# Patient Record
Sex: Female | Born: 1980 | Race: Black or African American | Hispanic: No | Marital: Single | Smoking: Never smoker
Health system: Southern US, Community
[De-identification: ages and names within clinical notes are randomized; demographics above are authoritative.]

## PROBLEM LIST (undated history)

## (undated) DIAGNOSIS — A599 Trichomoniasis, unspecified: Secondary | ICD-10-CM

## (undated) DIAGNOSIS — N200 Calculus of kidney: Secondary | ICD-10-CM

## (undated) DIAGNOSIS — O139 Gestational [pregnancy-induced] hypertension without significant proteinuria, unspecified trimester: Secondary | ICD-10-CM

## (undated) DIAGNOSIS — F329 Major depressive disorder, single episode, unspecified: Secondary | ICD-10-CM

## (undated) DIAGNOSIS — Z8669 Personal history of other diseases of the nervous system and sense organs: Secondary | ICD-10-CM

## (undated) DIAGNOSIS — Z8742 Personal history of other diseases of the female genital tract: Secondary | ICD-10-CM

## (undated) DIAGNOSIS — K219 Gastro-esophageal reflux disease without esophagitis: Secondary | ICD-10-CM

## (undated) DIAGNOSIS — IMO0001 Reserved for inherently not codable concepts without codable children: Secondary | ICD-10-CM

## (undated) DIAGNOSIS — J45909 Unspecified asthma, uncomplicated: Secondary | ICD-10-CM

## (undated) DIAGNOSIS — R109 Unspecified abdominal pain: Secondary | ICD-10-CM

## (undated) DIAGNOSIS — Z973 Presence of spectacles and contact lenses: Secondary | ICD-10-CM

## (undated) DIAGNOSIS — D649 Anemia, unspecified: Secondary | ICD-10-CM

## (undated) DIAGNOSIS — Z87442 Personal history of urinary calculi: Secondary | ICD-10-CM

## (undated) DIAGNOSIS — I10 Essential (primary) hypertension: Secondary | ICD-10-CM

## (undated) HISTORY — DX: Unspecified abdominal pain: R10.9

## (undated) HISTORY — PX: OTHER SURGICAL HISTORY: SHX169

## (undated) HISTORY — PX: LITHOTRIPSY: SUR834

## (undated) HISTORY — DX: Personal history of other diseases of the nervous system and sense organs: Z86.69

## (undated) HISTORY — PX: WISDOM TOOTH EXTRACTION: SHX21

## (undated) HISTORY — DX: Presence of spectacles and contact lenses: Z97.3

## (undated) HISTORY — DX: Trichomoniasis, unspecified: A59.9

## (undated) HISTORY — DX: Reserved for inherently not codable concepts without codable children: IMO0001

## (undated) HISTORY — DX: Major depressive disorder, single episode, unspecified: F32.9

## (undated) HISTORY — DX: Personal history of other diseases of the female genital tract: Z87.42

## (undated) HISTORY — DX: Personal history of urinary calculi: Z87.442

## (undated) HISTORY — DX: Calculus of kidney: N20.0

## (undated) HISTORY — DX: Anemia, unspecified: D64.9

## (undated) HISTORY — PX: KIDNEY STONE SURGERY: SHX686

## (undated) HISTORY — DX: Gestational (pregnancy-induced) hypertension without significant proteinuria, unspecified trimester: O13.9

---
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
## 1999-10-06 ENCOUNTER — Encounter: Payer: Self-pay | Admitting: Emergency Medicine

## 1999-10-06 ENCOUNTER — Emergency Department (HOSPITAL_COMMUNITY): Admission: EM | Admit: 1999-10-06 | Discharge: 1999-10-06 | Payer: Self-pay | Admitting: Emergency Medicine

## 2002-12-16 ENCOUNTER — Encounter: Payer: Self-pay | Admitting: Emergency Medicine

## 2002-12-16 ENCOUNTER — Emergency Department (HOSPITAL_COMMUNITY): Admission: EM | Admit: 2002-12-16 | Discharge: 2002-12-16 | Payer: Self-pay | Admitting: Emergency Medicine

## 2003-08-13 DIAGNOSIS — A599 Trichomoniasis, unspecified: Secondary | ICD-10-CM

## 2003-08-13 HISTORY — DX: Trichomoniasis, unspecified: A59.9

## 2003-10-19 ENCOUNTER — Other Ambulatory Visit: Admission: RE | Admit: 2003-10-19 | Discharge: 2003-10-19 | Payer: Self-pay | Admitting: Obstetrics and Gynecology

## 2004-03-15 ENCOUNTER — Inpatient Hospital Stay (HOSPITAL_COMMUNITY): Admission: AD | Admit: 2004-03-15 | Discharge: 2004-03-16 | Payer: Self-pay | Admitting: Obstetrics and Gynecology

## 2004-03-29 ENCOUNTER — Inpatient Hospital Stay (HOSPITAL_COMMUNITY): Admission: AD | Admit: 2004-03-29 | Discharge: 2004-04-01 | Payer: Self-pay | Admitting: Family Medicine

## 2004-07-11 ENCOUNTER — Emergency Department (HOSPITAL_COMMUNITY): Admission: EM | Admit: 2004-07-11 | Discharge: 2004-07-12 | Payer: Self-pay | Admitting: Emergency Medicine

## 2004-07-13 ENCOUNTER — Ambulatory Visit (HOSPITAL_BASED_OUTPATIENT_CLINIC_OR_DEPARTMENT_OTHER): Admission: RE | Admit: 2004-07-13 | Discharge: 2004-07-13 | Payer: Self-pay | Admitting: Urology

## 2004-07-13 ENCOUNTER — Ambulatory Visit (HOSPITAL_COMMUNITY): Admission: RE | Admit: 2004-07-13 | Discharge: 2004-07-13 | Payer: Self-pay | Admitting: Urology

## 2004-07-19 ENCOUNTER — Ambulatory Visit (HOSPITAL_COMMUNITY): Admission: RE | Admit: 2004-07-19 | Discharge: 2004-07-19 | Payer: Self-pay | Admitting: Urology

## 2005-03-25 ENCOUNTER — Emergency Department (HOSPITAL_COMMUNITY): Admission: EM | Admit: 2005-03-25 | Discharge: 2005-03-25 | Payer: Self-pay | Admitting: Emergency Medicine

## 2005-11-16 ENCOUNTER — Emergency Department (HOSPITAL_COMMUNITY): Admission: EM | Admit: 2005-11-16 | Discharge: 2005-11-16 | Payer: Self-pay | Admitting: Emergency Medicine

## 2005-12-26 ENCOUNTER — Other Ambulatory Visit: Admission: RE | Admit: 2005-12-26 | Discharge: 2005-12-26 | Payer: Self-pay | Admitting: Obstetrics and Gynecology

## 2006-02-22 ENCOUNTER — Emergency Department (HOSPITAL_COMMUNITY): Admission: EM | Admit: 2006-02-22 | Discharge: 2006-02-22 | Payer: Self-pay | Admitting: Emergency Medicine

## 2006-05-19 ENCOUNTER — Emergency Department (HOSPITAL_COMMUNITY): Admission: EM | Admit: 2006-05-19 | Discharge: 2006-05-20 | Payer: Self-pay | Admitting: Emergency Medicine

## 2006-08-29 ENCOUNTER — Emergency Department (HOSPITAL_COMMUNITY): Admission: EM | Admit: 2006-08-29 | Discharge: 2006-08-29 | Payer: Self-pay | Admitting: Emergency Medicine

## 2006-09-18 ENCOUNTER — Emergency Department (HOSPITAL_COMMUNITY): Admission: EM | Admit: 2006-09-18 | Discharge: 2006-09-18 | Payer: Self-pay | Admitting: Family Medicine

## 2006-11-06 ENCOUNTER — Encounter: Payer: Self-pay | Admitting: Family Medicine

## 2006-11-06 ENCOUNTER — Ambulatory Visit: Payer: Self-pay | Admitting: Family Medicine

## 2006-11-06 ENCOUNTER — Encounter (INDEPENDENT_AMBULATORY_CARE_PROVIDER_SITE_OTHER): Payer: Self-pay | Admitting: Family Medicine

## 2006-11-06 DIAGNOSIS — R7989 Other specified abnormal findings of blood chemistry: Secondary | ICD-10-CM | POA: Insufficient documentation

## 2006-11-06 DIAGNOSIS — N200 Calculus of kidney: Secondary | ICD-10-CM | POA: Insufficient documentation

## 2006-11-06 DIAGNOSIS — J309 Allergic rhinitis, unspecified: Secondary | ICD-10-CM | POA: Insufficient documentation

## 2006-11-06 DIAGNOSIS — G609 Hereditary and idiopathic neuropathy, unspecified: Secondary | ICD-10-CM | POA: Insufficient documentation

## 2006-11-06 LAB — CONVERTED CEMR LAB
AST: 29 units/L (ref 0–37)
Alkaline Phosphatase: 73 units/L (ref 39–117)
BUN: 7 mg/dL (ref 6–23)
CO2: 29 meq/L (ref 19–32)
Chloride: 107 meq/L (ref 96–112)
Creatinine, Ser: 0.5 mg/dL (ref 0.4–1.2)
Potassium: 3.9 meq/L (ref 3.5–5.1)
Sodium: 138 meq/L (ref 135–145)
TSH: 0.66 microintl units/mL (ref 0.35–5.50)
Total Bilirubin: 0.6 mg/dL (ref 0.3–1.2)
Total Protein: 6.5 g/dL (ref 6.0–8.3)

## 2006-11-13 ENCOUNTER — Ambulatory Visit: Payer: Self-pay | Admitting: Family Medicine

## 2006-11-13 LAB — CONVERTED CEMR LAB
Glucose, 2 hour: 108 mg/dL (ref 70–139)
Glucose, GTT - 3 Hour: 78 mg/dL

## 2006-12-18 ENCOUNTER — Encounter (INDEPENDENT_AMBULATORY_CARE_PROVIDER_SITE_OTHER): Payer: Self-pay | Admitting: Family Medicine

## 2007-01-30 ENCOUNTER — Encounter (INDEPENDENT_AMBULATORY_CARE_PROVIDER_SITE_OTHER): Payer: Self-pay | Admitting: Family Medicine

## 2007-03-23 ENCOUNTER — Encounter (INDEPENDENT_AMBULATORY_CARE_PROVIDER_SITE_OTHER): Payer: Self-pay | Admitting: Family Medicine

## 2007-05-25 ENCOUNTER — Encounter (INDEPENDENT_AMBULATORY_CARE_PROVIDER_SITE_OTHER): Payer: Self-pay | Admitting: Family Medicine

## 2007-07-06 ENCOUNTER — Encounter (INDEPENDENT_AMBULATORY_CARE_PROVIDER_SITE_OTHER): Payer: Self-pay | Admitting: Family Medicine

## 2007-07-08 ENCOUNTER — Encounter: Admission: RE | Admit: 2007-07-08 | Discharge: 2007-07-08 | Payer: Self-pay | Admitting: Gastroenterology

## 2008-07-06 ENCOUNTER — Inpatient Hospital Stay (HOSPITAL_COMMUNITY): Admission: EM | Admit: 2008-07-06 | Discharge: 2008-07-07 | Payer: Self-pay | Admitting: Emergency Medicine

## 2008-07-22 ENCOUNTER — Ambulatory Visit: Payer: Self-pay | Admitting: Vascular Surgery

## 2008-07-22 ENCOUNTER — Ambulatory Visit (HOSPITAL_COMMUNITY): Admission: RE | Admit: 2008-07-22 | Discharge: 2008-07-22 | Payer: Self-pay | Admitting: Orthopedic Surgery

## 2008-07-22 ENCOUNTER — Encounter (INDEPENDENT_AMBULATORY_CARE_PROVIDER_SITE_OTHER): Payer: Self-pay | Admitting: Orthopedic Surgery

## 2008-08-03 ENCOUNTER — Inpatient Hospital Stay (HOSPITAL_COMMUNITY): Admission: AD | Admit: 2008-08-03 | Discharge: 2008-08-03 | Payer: Self-pay | Admitting: Family Medicine

## 2008-08-08 ENCOUNTER — Encounter: Admission: RE | Admit: 2008-08-08 | Discharge: 2008-08-09 | Payer: Self-pay | Admitting: Orthopedic Surgery

## 2008-08-15 ENCOUNTER — Encounter: Admission: RE | Admit: 2008-08-15 | Discharge: 2008-11-13 | Payer: Self-pay | Admitting: Orthopedic Surgery

## 2008-11-17 ENCOUNTER — Encounter: Admission: RE | Admit: 2008-11-17 | Discharge: 2008-11-30 | Payer: Self-pay | Admitting: Orthopedic Surgery

## 2008-11-28 ENCOUNTER — Emergency Department (HOSPITAL_COMMUNITY): Admission: EM | Admit: 2008-11-28 | Discharge: 2008-11-28 | Payer: Self-pay | Admitting: Emergency Medicine

## 2009-01-17 ENCOUNTER — Inpatient Hospital Stay (HOSPITAL_COMMUNITY): Admission: AD | Admit: 2009-01-17 | Discharge: 2009-01-17 | Payer: Self-pay | Admitting: Obstetrics and Gynecology

## 2009-01-18 ENCOUNTER — Inpatient Hospital Stay (HOSPITAL_COMMUNITY): Admission: AD | Admit: 2009-01-18 | Discharge: 2009-01-18 | Payer: Self-pay | Admitting: Obstetrics & Gynecology

## 2009-02-09 ENCOUNTER — Inpatient Hospital Stay (HOSPITAL_COMMUNITY): Admission: AD | Admit: 2009-02-09 | Discharge: 2009-02-09 | Payer: Self-pay | Admitting: Obstetrics and Gynecology

## 2009-03-02 ENCOUNTER — Inpatient Hospital Stay (HOSPITAL_COMMUNITY): Admission: AD | Admit: 2009-03-02 | Discharge: 2009-03-03 | Payer: Self-pay | Admitting: Obstetrics and Gynecology

## 2009-03-10 ENCOUNTER — Inpatient Hospital Stay (HOSPITAL_COMMUNITY): Admission: RE | Admit: 2009-03-10 | Discharge: 2009-03-12 | Payer: Self-pay | Admitting: Obstetrics and Gynecology

## 2009-03-10 ENCOUNTER — Encounter (INDEPENDENT_AMBULATORY_CARE_PROVIDER_SITE_OTHER): Payer: Self-pay | Admitting: Obstetrics and Gynecology

## 2009-03-10 DIAGNOSIS — O139 Gestational [pregnancy-induced] hypertension without significant proteinuria, unspecified trimester: Secondary | ICD-10-CM

## 2009-03-10 HISTORY — DX: Gestational (pregnancy-induced) hypertension without significant proteinuria, unspecified trimester: O13.9

## 2009-03-14 ENCOUNTER — Observation Stay (HOSPITAL_COMMUNITY): Admission: AD | Admit: 2009-03-14 | Discharge: 2009-03-16 | Payer: Self-pay | Admitting: Obstetrics and Gynecology

## 2009-03-20 DIAGNOSIS — Z8742 Personal history of other diseases of the female genital tract: Secondary | ICD-10-CM

## 2009-03-20 HISTORY — DX: Personal history of other diseases of the female genital tract: Z87.42

## 2009-04-05 DIAGNOSIS — IMO0001 Reserved for inherently not codable concepts without codable children: Secondary | ICD-10-CM

## 2009-04-05 HISTORY — DX: Reserved for inherently not codable concepts without codable children: IMO0001

## 2009-04-13 DIAGNOSIS — F32A Depression, unspecified: Secondary | ICD-10-CM

## 2009-04-13 HISTORY — DX: Depression, unspecified: F32.A

## 2010-07-07 ENCOUNTER — Emergency Department (HOSPITAL_COMMUNITY): Admission: EM | Admit: 2010-07-07 | Discharge: 2010-07-07 | Payer: Self-pay | Admitting: Emergency Medicine

## 2010-11-17 LAB — URINALYSIS, ROUTINE W REFLEX MICROSCOPIC
Bilirubin Urine: NEGATIVE
Glucose, UA: NEGATIVE mg/dL
Ketones, ur: 15 mg/dL — AB
Nitrite: NEGATIVE
Specific Gravity, Urine: >1.03 — ABNORMAL HIGH (ref 1.005–1.030)
pH: 6 (ref 5.0–8.0)

## 2010-11-17 LAB — URINE MICROSCOPIC-ADD ON

## 2010-11-17 LAB — URINE CULTURE: Colony Count: NO GROWTH

## 2010-11-17 LAB — CULTURE, BLOOD (ROUTINE X 2)

## 2010-11-17 LAB — DIFFERENTIAL
Eosinophils Relative: 0 % (ref 0–5)
Lymphocytes Relative: 6 % — ABNORMAL LOW (ref 12–46)
Lymphs Abs: 0.8 10*3/uL (ref 0.7–4.0)
Monocytes Absolute: 0.6 10*3/uL (ref 0.1–1.0)
Monocytes Relative: 4 % (ref 3–12)
Neutro Abs: 12.2 10*3/uL — ABNORMAL HIGH (ref 1.7–7.7)

## 2010-11-17 LAB — CBC
HCT: 33.6 % — ABNORMAL LOW (ref 36.0–46.0)
Hemoglobin: 11 g/dL — ABNORMAL LOW (ref 12.0–15.0)
RBC: 4.17 MIL/uL (ref 3.87–5.11)
RDW: 19.1 % — ABNORMAL HIGH (ref 11.5–15.5)

## 2010-11-18 LAB — CBC
HCT: 26.1 % — ABNORMAL LOW (ref 36.0–46.0)
HCT: 31.1 % — ABNORMAL LOW (ref 36.0–46.0)
Hemoglobin: 8.6 g/dL — ABNORMAL LOW (ref 12.0–15.0)
MCV: 80.2 fL (ref 78.0–100.0)
MCV: 80.7 fL (ref 78.0–100.0)
Platelets: 146 10*3/uL — ABNORMAL LOW (ref 150–400)
Platelets: 154 10*3/uL (ref 150–400)
RDW: 18.5 % — ABNORMAL HIGH (ref 11.5–15.5)
WBC: 14.6 10*3/uL — ABNORMAL HIGH (ref 4.0–10.5)
WBC: 16.1 10*3/uL — ABNORMAL HIGH (ref 4.0–10.5)

## 2010-11-18 LAB — URINALYSIS, DIPSTICK ONLY
Glucose, UA: NEGATIVE mg/dL
Leukocytes, UA: NEGATIVE
Protein, ur: 100 mg/dL — AB
pH: 6 (ref 5.0–8.0)

## 2010-11-18 LAB — RPR: RPR Ser Ql: NONREACTIVE

## 2010-11-18 LAB — COMPREHENSIVE METABOLIC PANEL
Albumin: 2.6 g/dL — ABNORMAL LOW (ref 3.5–5.2)
BUN: 5 mg/dL — ABNORMAL LOW (ref 6–23)
Calcium: 9.4 mg/dL (ref 8.4–10.5)
Glucose, Bld: 95 mg/dL (ref 70–99)
Potassium: 4.2 mEq/L (ref 3.5–5.1)
Sodium: 136 mEq/L (ref 135–145)
Total Protein: 5.9 g/dL — ABNORMAL LOW (ref 6.0–8.3)

## 2010-11-18 LAB — RAPID HIV SCREEN (WH-MAU): Rapid HIV Screen: NONREACTIVE

## 2010-11-18 LAB — URIC ACID: Uric Acid, Serum: 6 mg/dL (ref 2.4–7.0)

## 2010-11-19 LAB — URINALYSIS, ROUTINE W REFLEX MICROSCOPIC
Bilirubin Urine: NEGATIVE
Glucose, UA: NEGATIVE mg/dL
Hgb urine dipstick: NEGATIVE
Ketones, ur: NEGATIVE mg/dL
Nitrite: NEGATIVE
Specific Gravity, Urine: 1.01 (ref 1.005–1.030)
pH: 6 (ref 5.0–8.0)

## 2010-11-19 LAB — FETAL FIBRONECTIN: Fetal Fibronectin: NEGATIVE

## 2010-11-19 LAB — WET PREP, GENITAL
Trich, Wet Prep: NONE SEEN
Yeast Wet Prep HPF POC: NONE SEEN

## 2010-11-19 LAB — STREP B DNA PROBE

## 2010-12-25 NOTE — Discharge Summary (Signed)
[name], [name]                 ACCOUNT NO.:  [id number]   MEDICAL RECORD NO.:  [id number]          PATIENT TYPE:  INP   LOCATION:  9303                          FACILITY:  [facility]   PHYSICIAN:  Hal Morales, M.D.DATE OF BIRTH:  Nov 11, 1980   DATE OF ADMISSION:  03/14/2009  DATE OF DISCHARGE:  03/16/2009                               DISCHARGE SUMMARY   ADMITTING DIAGNOSES:  1. Status post spontaneous vaginal delivery on March 10, 2009.  2. Postpartum fever.   DISCHARGE DIAGNOSES:  1. Five days status post spontaneous vaginal delivery.  2. Resolving likely postpartum endometritis.   PROCEDURES:  None.   HOSPITAL COURSE:  Nicole [name] is a 30 year old gravida 3, para 2-0-1-0,  who was admitted on March 14, 2009, with history of a postpartum fever  to 100.5 the night before.  She was seen in the office on the day of  admission and was found to have some mild uterine tenderness.  Temperature at that time was 99.2.  She also had chills and body aches.  She was having no issues with her breath.  She was breast-feeding.  She  also denied any dysuria, any leg pain, any nausea or vomiting.  She was  sent to Maternity Admissions Unit for evaluation, white blood cell count  was 13.6, hemoglobin was 11, and platelet count was normal.  There was a  large amount of blood in the urine, 15 ketones were noted.  Her blood  pressures were 150s/170s over 70s to 99.  She was previously on  Procardia XL 30 mg for some chronic hypertension.  The uterus was well  bloated.  She was having small amount of lochia and had no foul odor.  The patient was admitted then for observation and IV antibiotics with  the likely diagnosis of endometritis.  She was given Rocephin 250 mg IM.  She was admitted overnight for further observation and given ampicillin  1 g IV q.6 h. and was medicated with Tylenol for fever.  By the morning  of March 15, 2009, she was feeling some better.  She was still having  some mild  lower uterine tenderness.  Her max temperature of 101.5.  It  was down at that time to 98 and her blood pressures 131/88.  She was  given the option of later that morning of remaining while going home,  p.o. antibiotics at that time she preferred to remain one more night due  to still having some chills.  Her ampicillin IV was discontinued.  She  was then started on Augmentin 875 mg one p.o. b.i.d.  She was continued  on ibuprofen and was held until she was at least 24 hours of post fever.  By the morning of August, 2010, she was feeling much better.  She had  taken pain medication and she still having some mild lower uterine  cramping and tenderness.  She was breast and bottle feeding.  Her max  temperature have been 99.8, at 0525 in the morning.  Her last temp of  any significant to 100.3 at 3 a.m. on March 15, 2009.  She denied  nausea, vomiting, dysuria, or any other issues.  Urine cultures were  negative.  Blood cultures were negative preliminarily.   Her physical exam was within normal limits.  She was having no rebound  or guarding.  She had had a bowel movement this morning.  Lochia was  scant.  Perineum was clear.  Dr. Pennie Rushing was consulted and the decision  was made to discharge the patient home.   DISCHARGE CONDITION:  Stable.   DISCHARGE INSTRUCTIONS:  Routine postpartum instructions were to  continue.  The patient will also monitor for fever greater than 100, any  chills or any other issues she will notify us.   DISCHARGE MEDICATIONS:  1. Augmentin 875 mg p.o. b.i.d. to complete a 7-day course.      Therefore, she will be on a total of additional 5 days.  2. Ibuprofen 600 mg p.o. q.6 h. p.r.n. pain.  3. The patient will continue her Procardia XL 30 mg p.o. daily.   DISCHARGE FOLLOWUP:  Will occur on Monday, March 20, 2009, at 11 a.m. as  previously scheduled at [facility] or p.r.n.      Nicole [name], C.N.M.      Hal Morales, M.D.   Electronically Signed    VLL/MEDQ  D:  03/16/2009  T:  03/17/2009  Job:  [id number]

## 2010-12-25 NOTE — H&P (Signed)
NAMEJUSTYCE, YEATER                 ACCOUNT NO.:  [id number]   MEDICAL RECORD NO.:  [id number]          PATIENT TYPE:  INP   LOCATION:  9125                          FACILITY:  [facility]   PHYSICIAN:  Naima A. Dillard, M.D. DATE OF BIRTH:  Oct 20, 1980   DATE OF ADMISSION:  03/10/2009  DATE OF DISCHARGE:                              HISTORY & PHYSICAL   ADMIT NOTE:  A 30 year old gravida 3, para 1, AB1 with last menstrual  period on May 28, 2008 with [facility] is March 04, 2009, is a female from  [place] presents to Labor and Delivery for induction of labor for  postdates.   ADMITTING DIAGNOSES:  1. Intrauterine pregnancy at 65 and 6/7th postdates.  2. Increased body mass index.  3. History of asthma, she uses albuterol inhaler.  4. History of migraines.  5. History of anemia on iron supplements.   HISTORY OF PRESENT ILLNESS:  Entry to care of [facility]  beginning on September 19, 2008 presented on crutches due to a broken  ankle, tibia, and fibula.  She had a cast on.  The anatomy scan was  within normal limits.  The quad screen was negative. Cervical length  3.12 initially, placenta location was posterior, history of anemia, and  she has been treated with ferrous sulfate at pregnancy.  She was treated  for urinary tract infection at pregnancy due to E. coli and she was on  Macrobid.   LABORATORY DATA:  She is O positive, Rh antibody negative, sickle cell  trait negative, rubella immune, one-hour Glucola was 97, CF negative,  Pap within normal limits.  GC and Chlamydia negative.  Ordered an HIV,  not on the chart.  GBS is negative.   PAST MEDICAL HISTORY:  She has seasonal allergies.  She has had a  history of kidney stones, denied latex or medication allergies.  She is  asthmatic, uses inhaler.  Denies epilepsy or heart disease.   PAST SURGICAL HISTORY:  She had an elective abortion in 2000, surgery on  kidney stones x3, 2004, 2005, and 2007; fracture of  tibia, fibula  and  ankle in 2009;  wisdom teeth 2005 and 2007.   PAST OBSTETRIC HISTORY:  Menses began at 30 years old, every 28 days,  lasting 2-4 days.  In 2005, she had a spontaneous vaginal delivery of a  viable female infant after 12 hours of labor, weighing 6 pounds 14  ounces.  In 2000, she had an elective AB and she has a history of  __________.   SOCIAL HISTORY:  She is married to B[place]roft, who is a high Optician, dispensing on leave.  The patient is a high school graduate as well as her  husband.  The patient is from [place] of [place] and speaks [place].  Denies drugs, alcohol, or smoking.   FAMILY HISTORY:  Mother has chronic hypertension.  Her sister had  leukemia and is now deceased.  Her husband has twin sisters.   ADMISSION PHYSICAL:  HEART:  Regular rate without murmur.  LUNGS:  Clear bilaterally.  ABDOMEN:  Gravid.  Estimated fetal weight 7.5-8 pounds.  GU:  Vaginal exam per Dr. Normand Sloop, she was 3-4 cm with spontaneous  rupture of membranes, 70% effaced with -3 vertex.  Fetal heart tone 130-  140, positive accelerations, contractions every 2-3 minutes.   ASSESSMENT:  Intrauterine pregnancy at 40.6 induction of labor due to  postdates.   PLAN:  Admit to [facility] Redmond [facility].  Epidural if requested.  Pain medication IV.  HIV was ordered due to none  on the chart.  Anticipate birth.      Jasmine Awe, CNM      Naima A. Normand Sloop, M.D.  Electronically Signed    JM/MEDQ  D:  03/10/2009  T:  03/10/2009  Job:  [id number]

## 2010-12-25 NOTE — Op Note (Signed)
Nicole [name], Nicole [name]                 ACCOUNT NO.:  [id number]   MEDICAL RECORD NO.:  [id number]          PATIENT TYPE:  INP   LOCATION:  1610                         FACILITY:  [facility]   PHYSICIAN:  Burnard Bunting, M.D.    DATE OF BIRTH:  1980/10/02   DATE OF PROCEDURE:  07/06/2008  DATE OF DISCHARGE:                               OPERATIVE REPORT   PREOPERATIVE DIAGNOSIS:  Left ankle trimalleolar ankle fracture.   POSTOPERATIVE DIAGNOSIS:  Left ankle trimalleolar ankle fracture.   PROCEDURE:  Left ankle trimalleolar ankle fracture open reduction  internal fixation of the bimalleolar portion.   SURGEON:  Burnard Bunting, M.D.   ASSISTANT:  Jerolyn Shin. Tresa Res, M.D.   ANESTHESIA:  Spinal.   ESTIMATED BLOOD LOSS:  12 mL.   INDICATIONS:  [name] is a 30 year old patient who injured her left ankle.  She presents now for operative management after explanation of risks and  benefits.   PROCEDURE IN DETAIL:  Patient brought to the operative room where spinal  anesthesia was induced. Preoperative IV antibiotics were administered.  Left leg was prescrubbed with alcohol and Betadine which was allowed to  air dry. Then, prepped with DuraPrep solution and draped in a sterile  manner. [name] was used to cover the operative field. Ankle Esmarch was  utilized for approximately 50 minutes. Lateral incision was made. Care  was taken to avoid injury to the superficial perineal nerve. The  fracture was identified. Periosteum was elevated anteriorly and  posteriorly. Fracture site was irrigated, reduced, and fixed with one  3.5 lag screw placed proximal anterior to distal posterior. Good  reduction of the fracture was achieved. A 7-hole lateral fibular plate  was applied with good purchase of the screws. Syndesmosis was then  tested and found to be stable. At this time, the incision was irrigated  and packed with a small wet sponge. Medial incision was then made over  the medial malleolus. Care was taken  to avoid injury to the saphenous  vein and nerve. Fracture was identified. Periosteum was elevated from  out of the fracture site. Irrigation was performed. Fracture was reduced  and held with reduction clamps. Two 60-mm malleolar screws were placed  with good purchase obtained. Under fluoroscopic examination, screw  lengths were found to be intact. The mortis was reduced. At this time,  the syndesmosis was again tested under fluoroscopy and found to be  stable.  Tourniquet was released. Both incisions were then irrigated and  closed using interrupted running 2-0 Vicryl suture and 3-0 simple nylon  suture. Bulky well padded posterior splint was applied. The patient  tolerated  the procedure well without immediate complication. Dr. Lenny Pastel  assistance was required during the case for the expeditious nature of  the case.  Because the patient is pregnant and had spinal anesthesia as  well as her limb positioning and retraction of important neurovascular  structures, his assistance was a medical necessity.      Burnard Bunting, M.D.  Electronically Signed     GSD/MEDQ  D:  07/06/2008  T:  07/07/2008  Job:  [id number] 

## 2010-12-25 NOTE — Discharge Summary (Signed)
NAMETUERE, NWOSU                 ACCOUNT NO.:  [id number]   MEDICAL RECORD NO.:  [id number]          PATIENT TYPE:  INP   LOCATION:  9303                          [facility]ACILITY:  [facility]   PHYSICIAN:  Naima A. Dillard, M.D. DATE OF BIRTH:  1981/07/16   DATE OF ADMISSION:  03/14/2009  DATE OF DISCHARGE:                               DISCHARGE SUMMARY   A 30 year old gravida 3, para 2, AB 1 presented to the office in MAU  complaining of fever.  She had a spontaneous vaginal delivery of a  viable female infant weighing 710 with an Apgar of 9 and 9 on March 10, 2009.  Presented to the office with chief complaint of fever over 101.  She states she took Tylenol prior to the office visit.  She presented  from the [facility] for evaluation.  Her labs at that point  were WBC 13.6, hemoglobin 11, hematocrit 33, ketones 15.  Blood in her  urine was large.  Urine was yellow.  Temperature 98.3, pulse 116,  respirations 20, blood pressure ranged 171/99, 159/86.  The patient  stated she did not take her labetalol today.  After the labetalol was  given in the MAU, her blood pressures were 133/88 to 120/79.  Lungs were  clear bilaterally.  Heart, regular rate without murmur.  Abdomen, tender  of the abdomen.  Breasts were full.  She is breastfeeding.  Uterus is 2  fingerbreadths below the umbilicus.  Lochia rubra, negative Homans.   ASSESSMENT:  Postpartum day #5, rule out endometritis and she is  breastfeeding.  Consult with Dr. Stefano Gaul.  We will give Rocephin 250 mg  IM and a script for Augmentin which is safe in pregnancy.  She was to  increase fluids and rest and keep appointment or call us uncomfortable.  While waiting to go home, the nurse called and stated    Dictation ended at this point.      Jasmine Awe, CNM      Naima A. Normand Sloop, M.D.  Electronically Signed    JM/MEDQ  D:  03/15/2009  T:  03/16/2009  Job:  [id number]

## 2010-12-25 NOTE — Consult Note (Signed)
NAMEJEARLEAN, [name]                 ACCOUNT NO.:  [id number]   MEDICAL RECORD NO.:  [id number]          PATIENT TYPE:  INP   LOCATION:  0108                         [facility]AC[facility]   PHYSICIAN:  Burnard Bunting, M.D.    DATE OF BIRTH:  06/04/1981   DATE OF CONSULTATION:  DATE OF DISCHARGE:                                 CONSULTATION   REFERRING Nicole [name]:  Nicole Dolly, PA   CHIEF COMPLAINT:  Back pain.   HISTORY OF PRESENT ILLNESS:  Nicole [name] is a 30 year old female who  injured her left ankle when she fell at home.  She had no loss of  consciousness but was unable to bear weight.  She denies any other  orthopedic complaints.  She denies any numbness or tingling in the foot.  Her pain is quite severe at times.  She states she is comfortable lying  in bed.   PAST MEDICAL HISTORY:  Notable for asthma and kidney stones.   PAST SURGICAL HISTORY:  Negative.   CURRENT MEDICATIONS:  Zyrtec.   ALLERGIES:  None.   All systems reviewed are negative.  The patient is [redacted] weeks pregnant.   She works as a Advertising copywriter.  She does not smoke or drink.  She is here  with her sister.   PHYSICAL EXAMINATION:  She is well-developed, well-nourished, in no  acute distress.  Blood pressure 137/77, pulse 77, respirations 52, temperature is 98.2.  CHEST:  Clear to auscultation.  HEART:  Heart beat is regular.  ABDOMEN:  Benign.  She has full range of motion of her upper extremities.  Palpable radial  pulses bilaterally.  Left ankle demonstrates mild deformity.  Sensation  is intact on dorsal and plantar aspect of the foot.  Compartments are  soft.  The skin is intact on both the medial and lateral side.  She does  have a bit of an external rotation deformity to the ankle.   Radiographs show bimalleolar ankle fracture-dislocation.  EKG and labs  are pending.   IMPRESSION:  Bimalleolar ankle fracture-dislocation with no imminent  skin breakdown in an 8-week pregnant female.  Her medical  decision-  making is definitely complicated by the fact that she is [redacted] weeks  pregnant and I have talked with the anesthesiologist, and we will likely  proceed with a spinal anesthesia for the patient with limited  fluoroscopy time.  Risks and benefits of the procedure were discussed  with the patient and her husband, who is with her tonight.  They include  but are not limited to infection, nonunion, malunion, deep vein  thrombosis.  All questions are answered.  We will see her the surgery  night.      Burnard Bunting, M.D.  Electronically Signed     GSD/MEDQ  D:  07/06/2008  T:  07/07/2008  Job:  [id number]

## 2011-05-14 LAB — BASIC METABOLIC PANEL
CO2: 22
Calcium: 8.8
GFR calc Af Amer: >60
Glucose, Bld: 101 — ABNORMAL HIGH
Potassium: 3.6
Sodium: 136

## 2011-05-14 LAB — DIFFERENTIAL
Basophils Relative: 0
Eosinophils Relative: 1
Lymphocytes Relative: 20
Monocytes Absolute: 0.7
Monocytes Relative: 8
Neutro Abs: 6.7

## 2011-05-14 LAB — URINALYSIS, ROUTINE W REFLEX MICROSCOPIC
Ketones, ur: NEGATIVE
Nitrite: NEGATIVE
Protein, ur: NEGATIVE

## 2011-05-14 LAB — CBC
HCT: 37.3
Hemoglobin: 12.1
MCHC: 32.5
RBC: 4.61
RDW: 15.3

## 2011-05-14 LAB — TYPE AND SCREEN: ABO/RH(D): O POS

## 2011-05-14 LAB — URINE MICROSCOPIC-ADD ON

## 2011-05-14 LAB — APTT: aPTT: 28

## 2011-05-17 LAB — CBC
HCT: 35.6 % — ABNORMAL LOW (ref 36.0–46.0)
Hemoglobin: 11.6 g/dL — ABNORMAL LOW (ref 12.0–15.0)
MCV: 80.4 fL (ref 78.0–100.0)
RBC: 4.43 MIL/uL (ref 3.87–5.11)
WBC: 12.2 10*3/uL — ABNORMAL HIGH (ref 4.0–10.5)

## 2011-05-17 LAB — WET PREP, GENITAL
Clue Cells Wet Prep HPF POC: NONE SEEN
Trich, Wet Prep: NONE SEEN

## 2011-05-17 LAB — GC/CHLAMYDIA PROBE AMP, GENITAL
Chlamydia, DNA Probe: NEGATIVE
GC Probe Amp, Genital: NEGATIVE

## 2012-01-16 ENCOUNTER — Emergency Department (HOSPITAL_COMMUNITY)
Admission: EM | Admit: 2012-01-16 | Discharge: 2012-01-17 | Disposition: A | Discharge status: Home / Self Care | Payer: Medicaid Other | Attending: Emergency Medicine | Admitting: Emergency Medicine

## 2012-01-16 ENCOUNTER — Encounter (HOSPITAL_COMMUNITY): Payer: Self-pay | Admitting: *Deleted

## 2012-01-16 DIAGNOSIS — R10816 Epigastric abdominal tenderness: Secondary | ICD-10-CM | POA: Insufficient documentation

## 2012-01-16 DIAGNOSIS — R110 Nausea: Secondary | ICD-10-CM | POA: Insufficient documentation

## 2012-01-16 DIAGNOSIS — R109 Unspecified abdominal pain: Secondary | ICD-10-CM

## 2012-01-16 DIAGNOSIS — I10 Essential (primary) hypertension: Secondary | ICD-10-CM | POA: Insufficient documentation

## 2012-01-16 HISTORY — DX: Unspecified asthma, uncomplicated: J45.909

## 2012-01-16 HISTORY — DX: Essential (primary) hypertension: I10

## 2012-01-16 HISTORY — DX: Calculus of kidney: N20.0

## 2012-01-16 HISTORY — DX: Gastro-esophageal reflux disease without esophagitis: K21.9

## 2012-01-16 LAB — COMPREHENSIVE METABOLIC PANEL
Alkaline Phosphatase: 93 U/L (ref 39–117)
BUN: 9 mg/dL (ref 6–23)
CO2: 23 mEq/L (ref 19–32)
Chloride: 100 mEq/L (ref 96–112)
GFR calc Af Amer: >90 mL/min (ref >90)
GFR calc non Af Amer: >90 mL/min (ref >90)
Glucose, Bld: 85 mg/dL (ref 70–99)
Potassium: 3.7 mEq/L (ref 3.5–5.1)
Total Bilirubin: 0.2 mg/dL — ABNORMAL LOW (ref 0.3–1.2)

## 2012-01-16 LAB — URINALYSIS, ROUTINE W REFLEX MICROSCOPIC
Bilirubin Urine: NEGATIVE
Hgb urine dipstick: NEGATIVE
Ketones, ur: NEGATIVE mg/dL
Nitrite: NEGATIVE
Urobilinogen, UA: 0.2 mg/dL (ref 0.0–1.0)

## 2012-01-16 LAB — LIPASE, BLOOD: Lipase: 35 U/L (ref 11–59)

## 2012-01-16 MED ORDER — ONDANSETRON 8 MG PO TBDP
8.0000 mg | ORAL_TABLET | Freq: Once | ORAL | Status: AC
  Administered 2012-01-16: 8 mg via ORAL

## 2012-01-16 MED ORDER — ONDANSETRON 8 MG PO TBDP
ORAL_TABLET | ORAL | Status: AC
  Administered 2012-01-16: 8 mg via ORAL
  Filled 2012-01-16: qty 1

## 2012-01-16 NOTE — ED Notes (Signed)
Pt c/o abd pain since last night; upper epigastric; headache; nauseated and burping

## 2012-01-16 NOTE — ED Provider Notes (Signed)
History     CSN: [id number]  Arrival date & time 01/16/12  1851   First MD Initiated Contact with Patient 01/16/12 2228      Chief Complaint  Patient presents with  . Abdominal Pain    (Consider location/radiation/quality/duration/timing/severity/associated sxs/prior treatment) HPI Comments: Patient presents with epigastric abdominal pain that began last evening and has continued today.  She reports that eating made the pain worse.  She is nauseous, but no vomiting.  No fever or chills.  She has had regular BM.  Last BM was this morning and was soft.  No prior history of gallstones.    Patient is a 31 y.o. female presenting with abdominal pain. The history is provided by the patient.  Abdominal Pain The primary symptoms of the illness include abdominal pain and nausea. The primary symptoms of the illness do not include fever, shortness of breath, vomiting, diarrhea, dysuria, vaginal discharge or vaginal bleeding. The current episode started yesterday. The onset of the illness was gradual. The problem has been gradually worsening.  The patient states that she believes she is currently not pregnant. The patient has not had a change in bowel habit. Symptoms associated with the illness do not include chills, diaphoresis, constipation, urgency, hematuria or frequency. Significant associated medical issues do not include PUD, GERD or gallstones.    Past Medical History  Diagnosis Date  . Asthma   . Hypertension   . Kidney stone   . GERD (gastroesophageal reflux disease)     Past Surgical History  Procedure Date  . Lithotripsy     History reviewed. No pertinent family history.  History  Substance Use Topics  . Smoking status: Never Smoker   . Smokeless tobacco: Not on file  . Alcohol Use: No    OB History    Grav Para Term Preterm Abortions TAB SAB Ect Mult Living                  Review of Systems  Constitutional: Negative for fever, chills and diaphoresis.  HENT:  Negative for sore throat.   Respiratory: Negative for shortness of breath.   Cardiovascular: Negative for chest pain.  Gastrointestinal: Positive for nausea and abdominal pain. Negative for vomiting, diarrhea, constipation, blood in stool, abdominal distention and anal bleeding.  Genitourinary: Negative for dysuria, urgency, frequency, hematuria, flank pain, vaginal bleeding and vaginal discharge.  Neurological: Negative for dizziness and light-headedness.    Allergies  Review of patient's allergies indicates no known allergies.  Home Medications   Current Outpatient Rx  Name Route Sig Dispense Refill  . LISINOPRIL 5 MG PO TABS Oral Take 5 mg by mouth daily.    Marland Kitchen NAPROXEN SODIUM 220 MG PO TABS Oral Take 440 mg by mouth daily as needed. headache    . OMEPRAZOLE MAGNESIUM 20 MG PO TBEC Oral Take 20 mg by mouth daily.      BP 117/75  Pulse 63  Temp(Src) 97.9 F (36.6 C) (Oral)  SpO2 100%  LMP 01/09/2012  Physical Exam  Nursing note and vitals reviewed. Constitutional: She appears well-developed and well-nourished. No distress.  HENT:  Head: Normocephalic and atraumatic.  Mouth/Throat: Oropharynx is clear and moist.  Neck: Normal range of motion. Neck supple.  Cardiovascular: Normal rate, regular rhythm and normal heart sounds.   Pulmonary/Chest: Effort normal and breath sounds normal.  Abdominal: Soft. Normal appearance and bowel sounds are normal. She exhibits no distension and no mass. There is tenderness in the epigastric area. There is no rigidity,  no rebound, no guarding, no CVA tenderness, no tenderness at McBurney's point and negative Murphy's sign.  Neurological: She is alert.  Skin: Skin is warm and dry. She is not diaphoretic. No erythema.  Psychiatric: She has a normal mood and affect.    ED Course  Procedures (including critical care time)   Labs Reviewed  CBC  DIFFERENTIAL  COMPREHENSIVE METABOLIC PANEL  LIPASE, BLOOD  URINALYSIS, ROUTINE W REFLEX  MICROSCOPIC  PREGNANCY, URINE   No results found.   No diagnosis found.  1:04 AM Reassessed patient.  She reports that her symptoms have improved.    MDM  Patient presenting with epigastric pain.  Labs unremarkable.  Patient afebrile.  No vomiting.  No rebound or guarding on exam.  Patient instructed to follow up with PCP.  Return precautions discussed.        Pascal Lux Phillips, PA-C 01/19/12 2222

## 2012-01-17 LAB — DIFFERENTIAL
Eosinophils Absolute: 0.2 10*3/uL (ref 0.0–0.7)
Lymphocytes Relative: 34 % (ref 12–46)
Lymphs Abs: 3.2 10*3/uL (ref 0.7–4.0)
Monocytes Relative: 6 % (ref 3–12)
Neutro Abs: 5.3 10*3/uL (ref 1.7–7.7)
Neutrophils Relative %: 57 % (ref 43–77)

## 2012-01-17 LAB — CBC
Hemoglobin: 12.8 g/dL (ref 12.0–15.0)
MCH: 26.6 pg (ref 26.0–34.0)
RBC: 4.81 MIL/uL (ref 3.87–5.11)

## 2012-01-17 MED ORDER — OXYCODONE-ACETAMINOPHEN 5-325 MG PO TABS
1.0000 | ORAL_TABLET | Freq: Once | ORAL | Status: AC
  Administered 2012-01-17: 1 via ORAL
  Filled 2012-01-17: qty 1

## 2012-01-17 MED ORDER — GI COCKTAIL ~~LOC~~
30.0000 mL | Freq: Once | ORAL | Status: AC
  Administered 2012-01-17: 30 mL via ORAL
  Filled 2012-01-17: qty 30

## 2012-01-17 MED ORDER — HYDROCODONE-ACETAMINOPHEN 5-325 MG PO TABS: 2.0000 | ORAL_TABLET | ORAL | Status: AC | PRN

## 2012-01-17 MED ORDER — ONDANSETRON 8 MG PO TBDP: 8.0000 mg | ORAL_TABLET | Freq: Three times a day (TID) | ORAL | Status: AC | PRN

## 2012-01-17 NOTE — Discharge Instructions (Signed)
Follow up with the Gastroenterologist listed above for further evaluation of your abdominal pain. Only use your pain medication for severe pain. Do not operate heavy machinery while on pain medication or muscle relaxer. Note that your pain medication contains acetaminophen (Tylenol) & its is not recommended that you use additional acetaminophen (Tylenol) while taking this medication.   Abdominal Pain  Your exam might not show the exact reason you have abdominal pain. Since there are many different causes of abdominal pain, another checkup and more tests may be needed. It is very important to follow up for lasting (persistent) or worsening symptoms. A possible cause of abdominal pain in any person who still has his or her appendix is acute appendicitis. Appendicitis is often hard to diagnose. Normal blood tests, urine tests, ultrasound, and CT scans do not completely rule out early appendicitis or other causes of abdominal pain. Sometimes, only the changes that happen over time will allow appendicitis and other causes of abdominal pain to be determined. Other potential problems that may require surgery may also take time to become more apparent. Because of this, it is important that you follow all of the instructions below.   HOME CARE INSTRUCTIONS  Do not take laxatives unless directed by your caregiver. Rest as much as possible.  Do not eat solid food until your pain is gone: A diet of water, weak decaffeinated tea, broth or bouillon, gelatin, oral rehydration solutions (ORS), frozen ice pops, or ice chips may be helpful.  When pain is gone: Start a light diet (dry toast, crackers, applesauce, or white rice). Increase the diet slowly as long as it does not bother you. Eat no dairy products (including cheese and eggs) and no spicy, fatty, fried, or high-fiber foods.  Use no alcohol, caffeine, or cigarettes.  Take your regular medicines unless your caregiver told you not to.  Take any prescribed medicine  as directed.   SEEK IMMEDIATE MEDICAL CARE IF:  The pain does not go away.  You have a fever >101 that persists You keep throwing up (vomiting) or cannot drink liquids.  The pain becomes localized (Pain in the right side could possibly be appendicitis. In an adult, pain in the left lower portion of the abdomen could be colitis or diverticulitis). You pass bloody or black tarry stools.  You have shaking chills.  There is blood in your vomit or you see blood in your bowel movements.  Your bowel movements stop (become blocked) or you cannot pass gas.  You have bloody, frequent, or painful urination.  You have yellow discoloration in the skin or whites of the eyes.  Your stomach becomes bloated or bigger.  You have dizziness or fainting.  You have chest or back pain.

## 2012-01-21 NOTE — ED Provider Notes (Signed)
Medical screening examination/treatment/procedure(s) were performed by non-physician practitioner and as supervising physician I was immediately available for consultation/collaboration.   Gwyneth Sprout, MD 01/21/12 2204

## 2012-02-24 ENCOUNTER — Other Ambulatory Visit: Payer: Self-pay | Admitting: Gastroenterology

## 2012-02-24 DIAGNOSIS — R1013 Epigastric pain: Secondary | ICD-10-CM

## 2012-02-26 ENCOUNTER — Other Ambulatory Visit: Payer: Medicaid Other

## 2012-02-27 ENCOUNTER — Encounter: Payer: Self-pay | Admitting: Obstetrics and Gynecology

## 2012-02-27 ENCOUNTER — Ambulatory Visit (INDEPENDENT_AMBULATORY_CARE_PROVIDER_SITE_OTHER): Payer: Medicaid Other | Admitting: Obstetrics and Gynecology

## 2012-02-27 VITALS — BP 110/70 | Ht 65.0 in | Wt 226.0 lb

## 2012-02-27 DIAGNOSIS — Z124 Encounter for screening for malignant neoplasm of cervix: Secondary | ICD-10-CM

## 2012-02-27 DIAGNOSIS — Z309 Encounter for contraceptive management, unspecified: Secondary | ICD-10-CM

## 2012-02-27 NOTE — Progress Notes (Signed)
No complaints Wants Mirena and needs pap  Filed Vitals:   02/27/12 1210  BP: 110/70   ROS: noncontributory  Pelvic exam:  VULVA: normal appearing vulva with no masses, tenderness or lesions,  VAGINA: normal appearing vagina with normal color and discharge, no lesions, CERVIX: normal appearing cervix without discharge or lesions,  UTERUS: uterus is normal size, shape, consistency and nontender,  ADNEXA: normal adnexa in size, nontender and no masses.  A/P With menses for mirena Pap, gc, ct today with consent

## 2012-03-02 LAB — PAP IG, CT-NG, RFX HPV ASCU
Chlamydia Probe Amp: NEGATIVE
GC Probe Amp: NEGATIVE

## 2012-03-06 ENCOUNTER — Ambulatory Visit (INDEPENDENT_AMBULATORY_CARE_PROVIDER_SITE_OTHER): Payer: Medicaid Other | Admitting: Obstetrics and Gynecology

## 2012-03-06 ENCOUNTER — Encounter: Payer: Self-pay | Admitting: Obstetrics and Gynecology

## 2012-03-06 VITALS — BP 120/72 | Wt 222.0 lb

## 2012-03-06 DIAGNOSIS — Z30430 Encounter for insertion of intrauterine contraceptive device: Secondary | ICD-10-CM

## 2012-03-06 DIAGNOSIS — Z975 Presence of (intrauterine) contraceptive device: Secondary | ICD-10-CM

## 2012-03-06 DIAGNOSIS — IMO0001 Reserved for inherently not codable concepts without codable children: Secondary | ICD-10-CM

## 2012-03-06 MED ORDER — LEVONORGESTREL 20 MCG/24HR IU IUD
INTRAUTERINE_SYSTEM | Freq: Once | INTRAUTERINE | Status: AC
  Administered 2012-03-06: 1 via INTRAUTERINE

## 2012-03-06 NOTE — Addendum Note (Signed)
Addended byWinfred Leeds on: 03/06/2012 03:17 PM   Modules accepted: Orders

## 2012-03-06 NOTE — Patient Instructions (Signed)
Keep follow up appointment in 4 weeks  Call [facility] [phone]:  -for temperature of 100.4 degrees Fahrenheit or more -pain not improved with over the counter pain medications (Ibuprofen, Advil, Aleve,        Tylenol or acetaminophen) -for excessive bleeding (more than a usual period) -for any other concerns  Do not place anything in your vagina for the next 7 days   

## 2012-03-06 NOTE — Progress Notes (Signed)
IUD INSERTION NOTE  Nicole [name] is a 31 y.o. female [id number] who presents for IUD insertion.  Consent signed after risks and benefits were reviewed including but not limited to bleeding, infection, expulsion and risk of uterine perforation that may require an additional procedure for removal.  Patient's only question had to do with whether the device was 100% effective in preventing pregnancy.  Advised that the only contraception that is 100% is not having intercourse at all.  LMP: Patient's last menstrual period was 03/04/2012. UPT: negative   MIRENA LOT NUMBER: [id number]  Uterus assessed for position Prepped with Betadine (patient having menses) Tenaculum placed on anterior lip of cervix after Hurricane gel was applied Uterus sounded at  7.5  cm Insertion of MIRENA IUD per protocol without any complications   Assessment:  IUD Insertion  Plan:  1. Patient instructed to call with oral temperature of 100.4 degrees Fahrenheit or more, excessive bleeding or pain that is not relieved with OTC analgesia taken as directed  2. Patient instructed on how  to check IUD strings and encouraged to do so after each menstrual cycle  3. Advised not to place anything in vagina or have sexual intercourse for 7 days  4. Follow-up:  4 weeks  Madalyn Legner, PA-C   Tanea Moga PA-C 03/06/2012 2:06 PM

## 2012-03-16 ENCOUNTER — Encounter: Payer: Self-pay | Admitting: Obstetrics and Gynecology

## 2012-03-26 ENCOUNTER — Ambulatory Visit (INDEPENDENT_AMBULATORY_CARE_PROVIDER_SITE_OTHER): Payer: Medicaid Other | Admitting: Obstetrics and Gynecology

## 2012-03-26 ENCOUNTER — Encounter: Payer: Self-pay | Admitting: Obstetrics and Gynecology

## 2012-03-26 VITALS — BP 116/70 | HR 72 | Wt 228.0 lb

## 2012-03-26 DIAGNOSIS — Z30431 Encounter for routine checking of intrauterine contraceptive device: Secondary | ICD-10-CM

## 2012-03-26 DIAGNOSIS — N390 Urinary tract infection, site not specified: Secondary | ICD-10-CM

## 2012-03-26 LAB — POCT URINALYSIS DIPSTICK
Blood, UA: NEGATIVE
Ketones, UA: NEGATIVE
Protein, UA: NEGATIVE
pH, UA: 5

## 2012-03-26 LAB — POCT URINE PREGNANCY: Preg Test, Ur: NEGATIVE

## 2012-03-26 MED ORDER — CIPROFLOXACIN HCL 500 MG PO TABS: 500.0000 mg | ORAL_TABLET | Freq: Two times a day (BID) | ORAL | Status: AC

## 2012-03-26 NOTE — Progress Notes (Signed)
30 YO S/P IUD insertion for follow up. Patient reports no further bleeding and has only twinges, of pelvic discomfort from time to time.  Admits to urinary frequency but denies dysuria, hematuria, fever or flank pain.  O:  UPT-negative       U/A- SG-1.010 ,   pH 5.0,  leukocytes 2+  otherwise negative        Abdomen: soft, non-tender       Pelvic;  EGBUS-wnl, vagina-normal, cervix-string visible, uterus-no tenderness, adnexae-no masses/tenderness  A: IUD Follow up     UTI  P: Cipro 500 mg  #6 bid x 3 days          RTO- AEx or prn  Horris Speros, PA-C

## 2012-03-26 NOTE — Addendum Note (Signed)
Addended byWinfred Leeds on: 03/26/2012 12:12 PM   Modules accepted: Orders

## 2012-03-27 ENCOUNTER — Ambulatory Visit
Admission: RE | Admit: 2012-03-27 | Discharge: 2012-03-27 | Disposition: A | Discharge status: Home / Self Care | Payer: Medicaid Other | Source: Ambulatory Visit | Attending: Gastroenterology | Admitting: Gastroenterology

## 2012-03-27 DIAGNOSIS — R1013 Epigastric pain: Secondary | ICD-10-CM

## 2012-03-31 ENCOUNTER — Other Ambulatory Visit: Payer: Self-pay | Admitting: Gastroenterology

## 2012-03-31 DIAGNOSIS — R1013 Epigastric pain: Secondary | ICD-10-CM

## 2012-04-09 ENCOUNTER — Encounter (HOSPITAL_COMMUNITY)
Admission: RE | Admit: 2012-04-09 | Discharge: 2012-04-09 | Disposition: A | Discharge status: Home / Self Care | Payer: Medicaid Other | Source: Ambulatory Visit | Attending: Gastroenterology | Admitting: Gastroenterology

## 2012-04-09 DIAGNOSIS — R1013 Epigastric pain: Secondary | ICD-10-CM

## 2012-04-09 MED ORDER — SINCALIDE 5 MCG IJ SOLR
0.0200 ug/kg | Freq: Once | INTRAMUSCULAR | Status: AC
  Administered 2012-04-09: 2.1 ug via INTRAVENOUS

## 2012-04-09 MED ORDER — TECHNETIUM TC 99M MEBROFENIN IV KIT
5.0000 | PACK | Freq: Once | INTRAVENOUS | Status: AC | PRN
  Administered 2012-04-09: 5.5 via INTRAVENOUS

## 2012-04-20 ENCOUNTER — Ambulatory Visit (INDEPENDENT_AMBULATORY_CARE_PROVIDER_SITE_OTHER): Payer: Medicaid Other | Admitting: General Surgery

## 2012-04-28 ENCOUNTER — Encounter (HOSPITAL_BASED_OUTPATIENT_CLINIC_OR_DEPARTMENT_OTHER): Payer: Self-pay | Admitting: *Deleted

## 2012-04-28 ENCOUNTER — Ambulatory Visit (INDEPENDENT_AMBULATORY_CARE_PROVIDER_SITE_OTHER): Payer: Medicaid Other | Admitting: General Surgery

## 2012-04-28 ENCOUNTER — Encounter (INDEPENDENT_AMBULATORY_CARE_PROVIDER_SITE_OTHER): Payer: Self-pay | Admitting: General Surgery

## 2012-04-28 VITALS — BP 128/82 | HR 90 | Temp 97.1°F | Resp 24 | Ht 67.0 in | Wt 230.0 lb

## 2012-04-28 DIAGNOSIS — K828 Other specified diseases of gallbladder: Secondary | ICD-10-CM

## 2012-04-28 NOTE — Patient Instructions (Signed)
You have biliary dyskinesia.  We recommend a laparoscopic cholecystectomy.  YOu have been given a handout with this information.  Please call the office if you have any further questions.

## 2012-04-28 NOTE — Progress Notes (Signed)
Bring all medications. Coming Thursday for BMET and EKG.

## 2012-04-28 NOTE — Progress Notes (Signed)
Chief Complaint  Patient presents with  . Abdominal Pain    HISTORY:  Nicole [name] is a 31 y.o. female who presents to clinic with midepigastric pain.  This has been going on for years.  She has underwent an extensive workup.  She had a negative [place] and a HIDA that was abnormal with an EF of 15%.  She describes the pain as midepigastric in location.  The pain is worse about 30 mins after she eats.  She denies any back pain.  She does also have nausea and vomiting.  Past Medical History  Diagnosis Date  . Asthma   . Hypertension   . Kidney stone   . GERD (gastroesophageal reflux disease)   . Depression 04/13/2009  . Kidney stones   . Pregnancy induced hypertension 03/10/2009  . Trichomonas 2005  . History of kidney stones       x 3  . H/O migraine   . Anemia   . H/O endometritis 03/20/09  . Blues 04/05/09  . Wears glasses   . Abdominal pain        Past Surgical History  Procedure Date  . Lithotripsy   . Kidney stone surgery 2005 & 2007  . Therapeutic abortion 2000  . Wisdom tooth extraction 2005 & 2007      Current Outpatient Prescriptions  Medication Sig Dispense Refill  . losartan (COZAAR) 25 MG tablet Take 25 mg by mouth daily.      Marland Kitchen lisinopril (PRINIVIL,ZESTRIL) 5 MG tablet Take 5 mg by mouth daily.      . naproxen sodium (ANAPROX) 220 MG tablet Take 440 mg by mouth daily as needed. headache      . omeprazole (PRILOSEC OTC) 20 MG tablet Take 20 mg by mouth daily.         No Known Allergies    Family History  Problem Relation Age of Onset  . Hypertension Mother   . Cancer Sister     Leukemia  . Alcohol abuse Brother       History   Social History  . Marital Status: Married    Spouse Name: N/A    Number of Children: N/A  . Years of Education: N/A   Social History Main Topics  . Smoking status: Never Smoker   . Smokeless tobacco: Never Used  . Alcohol Use: No  . Drug Use: No  . Sexually Active: Yes    Birth Control/ Protection: IUD     mirena   Other  Topics Concern  . None   Social History Narrative  . None       REVIEW OF SYSTEMS - PERTINENT POSITIVES ONLY: Review of Systems - General ROS: negative for - chills, fever or weight loss Hematological and Lymphatic ROS: negative for - bleeding problems, blood clots or bruising Respiratory ROS: positive for - cough and asthma negative for - hemoptysis, pleuritic pain or shortness of breath Cardiovascular ROS: no chest pain or dyspnea on exertion Gastrointestinal ROS: positive for - abdominal pain, constipation, gas/bloating and nausea/vomiting negative for - appetite loss or blood in stools Genito-Urinary ROS: no dysuria, trouble voiding, or hematuria negative for - change in menstrual cycle  EXAM: Filed Vitals:   04/28/12 1049  BP: 128/82  Pulse: 90  Temp: 97.1 F (36.2 C)  Resp: 24    General appearance: alert, cooperative and no distress Resp: clear to auscultation bilaterally Cardio: regular rate and rhythm GI: soft, nontender No RUQ tenderness  LABORATORY RESULTS: Available labs are reviewed  Lab Results  Component Value Date   ALT 12 01/16/2012   AST 16 01/16/2012   ALKPHOS 93 01/16/2012   BILITOT 0.2* 01/16/2012     RADIOLOGY RESULTS:   Images and reports are reviewed. RUQ [place] 02/24/12  Gallbladder: No gallstones, gallbladder wall thickening, or pericholecystic fluid. Evaluation for a sonographic Murphy's sign is negative.   Common bile duct: Measures 2.7 mm in diameter and has a normal appearance    ASSESSMENT AND PLAN: Nicole [name] is a 31 y.o. female who presents to the office with long-standing abdominal pain, nausea and bloating.  She has been diagnosed with biliary dyskinesia with a HIDA scan (EF15%).  I have explained to the patient that the chances of getting rid of her abdominal pain are good but not definite.  I have discussed the risks of laparoscopic cholecystectomy, including leak, bleeding, infections and common bile duct injury.  She has elected to  proceed with surgery.  We will schedule this electively at her convenience.      Vanita Panda, MD Colon and Rectal Surgery / General Surgery [facility]      Visit Diagnoses: 1. Biliary dyskinesia     Primary Care Physician: Dorrene German, MD

## 2012-04-30 ENCOUNTER — Encounter (HOSPITAL_BASED_OUTPATIENT_CLINIC_OR_DEPARTMENT_OTHER)
Admission: RE | Admit: 2012-04-30 | Discharge: 2012-04-30 | Disposition: A | Discharge status: Home / Self Care | Payer: Medicaid Other | Source: Ambulatory Visit | Attending: General Surgery | Admitting: General Surgery

## 2012-04-30 LAB — BASIC METABOLIC PANEL
BUN: 11 mg/dL (ref 6–23)
CO2: 24 mEq/L (ref 19–32)
Calcium: 8.8 mg/dL (ref 8.4–10.5)
Creatinine, Ser: 0.6 mg/dL (ref 0.50–1.10)
Glucose, Bld: 86 mg/dL (ref 70–99)

## 2012-05-04 ENCOUNTER — Ambulatory Visit (HOSPITAL_BASED_OUTPATIENT_CLINIC_OR_DEPARTMENT_OTHER)
Admission: RE | Admit: 2012-05-04 | Discharge: 2012-05-04 | Disposition: A | Discharge status: Home / Self Care | Payer: Medicaid Other | Source: Ambulatory Visit | Attending: General Surgery | Admitting: General Surgery

## 2012-05-04 ENCOUNTER — Ambulatory Visit (HOSPITAL_BASED_OUTPATIENT_CLINIC_OR_DEPARTMENT_OTHER): Payer: Medicaid Other | Admitting: Certified Registered Nurse Anesthetist

## 2012-05-04 ENCOUNTER — Encounter (HOSPITAL_BASED_OUTPATIENT_CLINIC_OR_DEPARTMENT_OTHER): Payer: Self-pay

## 2012-05-04 ENCOUNTER — Encounter (HOSPITAL_BASED_OUTPATIENT_CLINIC_OR_DEPARTMENT_OTHER)
Admission: RE | Disposition: A | Discharge status: Home / Self Care | Payer: Self-pay | Source: Ambulatory Visit | Attending: General Surgery

## 2012-05-04 ENCOUNTER — Ambulatory Visit (HOSPITAL_COMMUNITY): Payer: Medicaid Other

## 2012-05-04 ENCOUNTER — Encounter (HOSPITAL_BASED_OUTPATIENT_CLINIC_OR_DEPARTMENT_OTHER): Payer: Self-pay | Admitting: Certified Registered Nurse Anesthetist

## 2012-05-04 DIAGNOSIS — K828 Other specified diseases of gallbladder: Secondary | ICD-10-CM | POA: Insufficient documentation

## 2012-05-04 DIAGNOSIS — K811 Chronic cholecystitis: Secondary | ICD-10-CM

## 2012-05-04 DIAGNOSIS — I10 Essential (primary) hypertension: Secondary | ICD-10-CM | POA: Insufficient documentation

## 2012-05-04 HISTORY — PX: CHOLECYSTECTOMY: SHX55

## 2012-05-04 SURGERY — LAPAROSCOPIC CHOLECYSTECTOMY WITH INTRAOPERATIVE CHOLANGIOGRAM
Anesthesia: General | Site: Abdomen | Wound class: Clean Contaminated

## 2012-05-04 MED ORDER — SODIUM CHLORIDE 0.9 % IR SOLN
Status: DC | PRN
  Administered 2012-05-04: 1

## 2012-05-04 MED ORDER — SODIUM CHLORIDE 0.9 % IV SOLN
INTRAVENOUS | Status: DC | PRN
  Administered 2012-05-04: 08:00:00

## 2012-05-04 MED ORDER — PROPOFOL 10 MG/ML IV BOLUS
INTRAVENOUS | Status: DC | PRN
  Administered 2012-05-04: 240 mg via INTRAVENOUS

## 2012-05-04 MED ORDER — LIDOCAINE HCL (CARDIAC) 20 MG/ML IV SOLN
INTRAVENOUS | Status: DC | PRN
  Administered 2012-05-04: 60 mg via INTRAVENOUS

## 2012-05-04 MED ORDER — HYDROMORPHONE HCL PF 1 MG/ML IJ SOLN
0.2500 mg | INTRAMUSCULAR | Status: DC | PRN
  Administered 2012-05-04 (×2): 0.5 mg via INTRAVENOUS

## 2012-05-04 MED ORDER — ROCURONIUM BROMIDE 100 MG/10ML IV SOLN
INTRAVENOUS | Status: DC | PRN
  Administered 2012-05-04: 40 mg via INTRAVENOUS

## 2012-05-04 MED ORDER — FENTANYL CITRATE 0.05 MG/ML IJ SOLN
INTRAMUSCULAR | Status: DC | PRN
  Administered 2012-05-04 (×2): 50 ug via INTRAVENOUS
  Administered 2012-05-04: 25 ug via INTRAVENOUS

## 2012-05-04 MED ORDER — NEOSTIGMINE METHYLSULFATE 1 MG/ML IJ SOLN
INTRAMUSCULAR | Status: DC | PRN
  Administered 2012-05-04: 3.5 mg via INTRAVENOUS

## 2012-05-04 MED ORDER — ONDANSETRON HCL 4 MG/2ML IJ SOLN: 4.0000 mg | Freq: Four times a day (QID) | INTRAMUSCULAR | Status: DC | PRN

## 2012-05-04 MED ORDER — SCOPOLAMINE 1 MG/3DAYS TD PT72
MEDICATED_PATCH | TRANSDERMAL | Status: DC | PRN
  Administered 2012-05-04: 1 mg via TRANSDERMAL

## 2012-05-04 MED ORDER — KETOROLAC TROMETHAMINE 30 MG/ML IJ SOLN
INTRAMUSCULAR | Status: DC | PRN
  Administered 2012-05-04: 30 mg via INTRAVENOUS

## 2012-05-04 MED ORDER — BUPIVACAINE-EPINEPHRINE 0.25% -1:200000 IJ SOLN
INTRAMUSCULAR | Status: DC | PRN
  Administered 2012-05-04: 7 mL

## 2012-05-04 MED ORDER — HYDROCODONE-ACETAMINOPHEN 5-325 MG PO TABS
1.0000 | ORAL_TABLET | Freq: Once | ORAL | Status: AC | PRN
  Administered 2012-05-04: 1 via ORAL

## 2012-05-04 MED ORDER — HYDROCODONE-ACETAMINOPHEN 5-325 MG PO TABS: 1.0000 | ORAL_TABLET | Freq: Four times a day (QID) | ORAL | Status: DC | PRN

## 2012-05-04 MED ORDER — ONDANSETRON HCL 4 MG/2ML IJ SOLN
INTRAMUSCULAR | Status: DC | PRN
  Administered 2012-05-04: 4 mg via INTRAVENOUS

## 2012-05-04 MED ORDER — CEFAZOLIN SODIUM-DEXTROSE 2-3 GM-% IV SOLR
2.0000 g | INTRAVENOUS | Status: AC
  Administered 2012-05-04: 3 g via INTRAVENOUS

## 2012-05-04 MED ORDER — LACTATED RINGERS IV SOLN
INTRAVENOUS | Status: DC
  Administered 2012-05-04 (×2): via INTRAVENOUS

## 2012-05-04 MED ORDER — DEXAMETHASONE SODIUM PHOSPHATE 4 MG/ML IJ SOLN
INTRAMUSCULAR | Status: DC | PRN
  Administered 2012-05-04: 10 mg via INTRAVENOUS

## 2012-05-04 MED ORDER — GLYCOPYRROLATE 0.2 MG/ML IJ SOLN
INTRAMUSCULAR | Status: DC | PRN
  Administered 2012-05-04: .5 mg via INTRAVENOUS

## 2012-05-04 MED ORDER — MIDAZOLAM HCL 5 MG/5ML IJ SOLN
INTRAMUSCULAR | Status: DC | PRN
  Administered 2012-05-04: 1 mg via INTRAVENOUS

## 2012-05-04 SURGICAL SUPPLY — 42 items
APPLIER CLIP 5 13 M/L LIGAMAX5 (MISCELLANEOUS)
BLADE SURG ROTATE 9660 (MISCELLANEOUS) IMPLANT
CANISTER SUCTION 2500CC (MISCELLANEOUS) ×2 IMPLANT
CATH REDDICK CHOLANGI 4FR 50CM (CATHETERS) ×2 IMPLANT
CHLORAPREP W/TINT 26ML (MISCELLANEOUS) ×2 IMPLANT
CLIP APPLIE 5 13 M/L LIGAMAX5 (MISCELLANEOUS) IMPLANT
CLOTH BEACON ORANGE TIMEOUT ST (SAFETY) ×2 IMPLANT
COVER MAYO STAND STRL (DRAPES) ×2 IMPLANT
COVER SURGICAL LIGHT HANDLE (MISCELLANEOUS) ×2 IMPLANT
DECANTER SPIKE VIAL GLASS SM (MISCELLANEOUS) ×4 IMPLANT
DERMABOND ADVANCED (GAUZE/BANDAGES/DRESSINGS) ×1
DERMABOND ADVANCED .7 DNX12 (GAUZE/BANDAGES/DRESSINGS) ×1 IMPLANT
DEVICE TROCAR PUNCTURE CLOSURE (ENDOMECHANICALS) ×2 IMPLANT
DRAPE C-ARM 42X72 X-RAY (DRAPES) ×2 IMPLANT
DRAPE UTILITY 15X26 W/TAPE STR (DRAPE) ×2 IMPLANT
ELECT REM PT RETURN 9FT ADLT (ELECTROSURGICAL) ×2
ELECTRODE REM PT RTRN 9FT ADLT (ELECTROSURGICAL) ×1 IMPLANT
GLOVE BIO SURGEON STRL SZ 6.5 (GLOVE) ×4 IMPLANT
GLOVE BIO SURGEON STRL SZ7 (GLOVE) ×2 IMPLANT
GLOVE BIO SURGEON STRL SZ7.5 (GLOVE) ×2 IMPLANT
GLOVE BIOGEL PI IND STRL 7.0 (GLOVE) ×3 IMPLANT
GLOVE BIOGEL PI INDICATOR 7.0 (GLOVE) ×3
GOWN PREVENTION PLUS XXLARGE (GOWN DISPOSABLE) ×2 IMPLANT
GOWN STRL NON-REIN LRG LVL3 (GOWN DISPOSABLE) ×6 IMPLANT
IV CATH AUTO 14GX1.75 SAFE ORG (IV SOLUTION) ×2 IMPLANT
PACK BASIN DAY SURGERY FS (CUSTOM PROCEDURE TRAY) ×2 IMPLANT
POUCH SPECIMEN RETRIEVAL 10MM (ENDOMECHANICALS) ×2 IMPLANT
SCISSORS LAP 5X35 DISP (ENDOMECHANICALS) IMPLANT
SET IRRIG TUBING LAPAROSCOPIC (IRRIGATION / IRRIGATOR) ×2 IMPLANT
SLEEVE ENDOPATH XCEL 5M (ENDOMECHANICALS) ×4 IMPLANT
SLEEVE SCD COMPRESS KNEE MED (MISCELLANEOUS) ×2 IMPLANT
SLEEVE SURGEON STRL (DRAPES) ×2 IMPLANT
SUT MNCRL AB 4-0 PS2 18 (SUTURE) ×2 IMPLANT
SUT MON AB 4-0 PC3 18 (SUTURE) ×2 IMPLANT
SUT VIC AB 2-0 SH 27 (SUTURE) ×1
SUT VIC AB 2-0 SH 27XBRD (SUTURE) ×1 IMPLANT
SUT VICRYL 0 TIES 12 18 (SUTURE) ×2 IMPLANT
SUT VICRYL 0 UR6 27IN ABS (SUTURE) IMPLANT
TOWEL OR 17X24 6PK STRL BLUE (TOWEL DISPOSABLE) ×2 IMPLANT
TRAY LAPAROSCOPIC (CUSTOM PROCEDURE TRAY) ×2 IMPLANT
TROCAR XCEL BLUNT TIP 100MML (ENDOMECHANICALS) ×2 IMPLANT
TROCAR XCEL NON-BLD 5MMX100MML (ENDOMECHANICALS) ×2 IMPLANT

## 2012-05-04 NOTE — Op Note (Signed)
PATIENT: Nicole [name] 31 y.o. female  PRE-OPERATIVE DIAGNOSIS: Biliary Dyskinesia   POST-OPERATIVE DIAGNOSIS: Biliary Dyskinesia   PROCEDURE: Procedure(s) (LRB) with comments:  LAPAROSCOPIC CHOLECYSTECTOMY WITH INTRAOPERATIVE CHOLANGIOGRAM (N/A) - Laparoscopic cholecysectomy with intraoperative cholangiogram   SURGEON: Surgeon(s) and Role:  * Romie Levee, MD - Primary  * Robyne Askew, MD - Assisting   ANESTHESIA: local and general   EBL: min  LOCAL MEDICATIONS USED: MARCAINE   SPECIMEN: Source of Specimen: Gallbladder   DISPOSITION OF SPECIMEN: PATHOLOGY  COUNTS: YES   PLAN OF CARE: Discharge to home after PACU   PATIENT DISPOSITION: PACU - hemodynamically stable.   Findings: umbilical hernia, normal appearing gallbladder  Procedure The patient was identified & brought into the operating room. The patient was positioned supine with arms out. SCDs were active during the entire case. The patient underwent general anesthesia without any difficulty.  The abdomen was prepped and draped in a sterile fashion. A Surgical Timeout confirmed the correct patient, position, pre-op antibiotics and operative plan.  We positioned the patient in reverse [place] & right side up.  I periumbilical incision was made with a 15 blade scalpel.  This was carried down through the subcuanteous tissues using blunt dissection.  The hernia sac was elevated with 2 kocher clamps and the abdomen was entered sharply.  Entry was clean.  A 0 vicryl suture was placed in pursestring fashion with the surrounding fascia and the [facility] port was introduced and fixed into place.  We induced carbon dioxide insufflation. Camera inspection revealed no injury.  There were no adhesions to the anterior abdominal wall supraumbilically.  I proceeded to continue with laparoscopic technique. I placed a #5 port in subxyphoid region obliquely within the falciform ligament, another 5mm port in the right flank near the anterior  axillary line, and a 5mm port in the right subcostal region.  Marcaine with epinephrine was infused in each incision.  I turned attention to the right upper quadrant.  The gallbladder fundus was elevated cephalad. I used cautery and blunt dissection to free the peritoneal coverings between the gallbladder and the liver on the posteriolateral and anteriomedial walls.   I used careful blunt dissection to help get a good critical view of the cystic artery and cystic duct. I did further dissection to free a few centimeters of the  gallbladder off the liver bed to get a good critical view of the infundibulum and cystic duct. I mobilized the cystic artery; and, after getting a good 360 view, ligated the cystic artery using clips. I skeletonized the cystic duct.  I placed a clip on the infundibulum. I did a partial cystic duct-otomy and ensured patency. I placed a cholangiocatheter through a puncture site at the right subcostal ridge of the abdominal wall and directed it into the cystic duct.  We ran a cholangiogram with dilute radio-opaque contrast and continuous fluoroscopy.  Contrast flowed from a side branch consistent with cystic duct cannulization. Contrast flowed up the common hepatic duct into the right and left intrahepatic chains out to secondary radicals. Contrast flowed down the common bile duct easily across the normal ampulla into the duodenum.  This was consistent with a normal cholangiogram.  There were no filling defects.  I removed the cholangiocatheter. I placed clips on the cystic duct x4. I completed cystic duct transection. I freed the gallbladder from its remaining attachments to the liver. I ensured hemostasis on the gallbladder fossa of the liver and elsewhere. I inspected the rest of the  abdomen & detected no injury nor bleeding elsewhere.  I removed the gallbladder through the periumbilical port site.   I then closed the periumbilical fascia transversely using 0 Vicryl suture  endoscopically using an endoscopic suture passer to close the hernia defect.  The previously pursetring suture was tied down as well. The skin was closed using 4-0 monocryl stitch and dermabond.   The patient was extubated & arrived in the PACU in stable condition.  All counts were correct per OR staff.  I had discussed postoperative care with the patient in the holding area.   I am about to locate the patient's family and discuss operative findings and postoperative goals / instructions.  Instructions are written in the chart as well.

## 2012-05-04 NOTE — Anesthesia Preprocedure Evaluation (Signed)
Anesthesia Evaluation  Patient identified by MRN, date of birth, ID band Patient awake    Reviewed: Allergy & Precautions, H&P , NPO status , Patient's Chart, lab work & pertinent test results  Airway Mallampati: II  Neck ROM: full    Dental   Pulmonary asthma ,          Cardiovascular hypertension,     Neuro/Psych Depression  Neuromuscular disease    GI/Hepatic GERD-  ,  Endo/Other  obese  Renal/GU      Musculoskeletal   Abdominal   Peds  Hematology   Anesthesia Other Findings   Reproductive/Obstetrics                           Anesthesia Physical Anesthesia Plan  ASA: II  Anesthesia Plan: General   Post-op Pain Management:    Induction: Intravenous  Airway Management Planned: Oral ETT  Additional Equipment:   Intra-op Plan:   Post-operative Plan: Extubation in OR  Informed Consent: I have reviewed the patients History and Physical, chart, labs and discussed the procedure including the risks, benefits and alternatives for the proposed anesthesia with the patient or authorized representative who has indicated his/her understanding and acceptance.     Plan Discussed with: CRNA and Surgeon  Anesthesia Plan Comments:         Anesthesia Quick Evaluation

## 2012-05-04 NOTE — Brief Op Note (Signed)
05/04/2012  9:03 AM  PATIENT:  Nicole [name]  31 y.o. female  PRE-OPERATIVE DIAGNOSIS:  Biliary Dyskinesia  POST-OPERATIVE DIAGNOSIS:  Biliary Dyskinesia  PROCEDURE:  Procedure(s) (LRB) with comments: LAPAROSCOPIC CHOLECYSTECTOMY WITH INTRAOPERATIVE CHOLANGIOGRAM (N/A) - Laparoscopic cholecysectomy with intraoperative cholangiogram  SURGEON:  Surgeon(s) and Role:    * Romie Levee, MD - Primary    * Robyne Askew, MD - Assisting  PHYSICIAN ASSISTANT:   ASSISTANTS: Toth   ANESTHESIA:   local and general  EBL:  Total I/O In: 1000 [I.V.:1000] Out: -   BLOOD ADMINISTERED:none  DRAINS: none   LOCAL MEDICATIONS USED:  MARCAINE     SPECIMEN:  Source of Specimen:  Gallbladder  DISPOSITION OF SPECIMEN:  PATHOLOGY  COUNTS:  YES  TOURNIQUET:  * No tourniquets in log *  DICTATION: .Dragon Dictation  PLAN OF CARE: Discharge to home after PACU  PATIENT DISPOSITION:  PACU - hemodynamically stable.   Delay start of Pharmacological VTE agent (>24hrs) due to surgical blood loss or risk of bleeding: not applicable

## 2012-05-04 NOTE — H&P (View-Only) (Signed)
Chief Complaint  Patient presents with  . Abdominal Pain    HISTORY:  Nicole [name] is a 31 y.o. female who presents to clinic with midepigastric pain.  This has been going on for years.  She has underwent an extensive workup.  She had a negative US and a HIDA that was abnormal with an EF of 15%.  She describes the pain as midepigastric in location.  The pain is worse about 30 mins after she eats.  She denies any back pain.  She does also have nausea and vomiting.  Past Medical History  Diagnosis Date  . Asthma   . Hypertension   . Kidney stone   . GERD (gastroesophageal reflux disease)   . Depression 04/13/2009  . Kidney stones   . Pregnancy induced hypertension 03/10/2009  . Trichomonas 2005  . History of kidney stones       x 3  . H/O migraine   . Anemia   . H/O endometritis 03/20/09  . Blues 04/05/09  . Wears glasses   . Abdominal pain        Past Surgical History  Procedure Date  . Lithotripsy   . Kidney stone surgery 2005 & 2007  . Therapeutic abortion 2000  . Wisdom tooth extraction 2005 & 2007      Current Outpatient Prescriptions  Medication Sig Dispense Refill  . losartan (COZAAR) 25 MG tablet Take 25 mg by mouth daily.      . lisinopril (PRINIVIL,ZESTRIL) 5 MG tablet Take 5 mg by mouth daily.      . naproxen sodium (ANAPROX) 220 MG tablet Take 440 mg by mouth daily as needed. headache      . omeprazole (PRILOSEC OTC) 20 MG tablet Take 20 mg by mouth daily.         No Known Allergies    Family History  Problem Relation Age of Onset  . Hypertension Mother   . Cancer Sister     Leukemia  . Alcohol abuse Brother       History   Social History  . Marital Status: Married    Spouse Name: N/A    Number of Children: N/A  . Years of Education: N/A   Social History Main Topics  . Smoking status: Never Smoker   . Smokeless tobacco: Never Used  . Alcohol Use: No  . Drug Use: No  . Sexually Active: Yes    Birth Control/ Protection: IUD     mirena   Other  Topics Concern  . None   Social History Narrative  . None       REVIEW OF SYSTEMS - PERTINENT POSITIVES ONLY: Review of Systems - General ROS: negative for - chills, fever or weight loss Hematological and Lymphatic ROS: negative for - bleeding problems, blood clots or bruising Respiratory ROS: positive for - cough and asthma negative for - hemoptysis, pleuritic pain or shortness of breath Cardiovascular ROS: no chest pain or dyspnea on exertion Gastrointestinal ROS: positive for - abdominal pain, constipation, gas/bloating and nausea/vomiting negative for - appetite loss or blood in stools Genito-Urinary ROS: no dysuria, trouble voiding, or hematuria negative for - change in menstrual cycle  EXAM: Filed Vitals:   04/28/12 1049  BP: 128/82  Pulse: 90  Temp: 97.1 F (36.2 C)  Resp: 24    General appearance: alert, cooperative and no distress Resp: clear to auscultation bilaterally Cardio: regular rate and rhythm GI: soft, nontender No RUQ tenderness  LABORATORY RESULTS: Available labs are reviewed    Lab Results  Component Value Date   ALT 12 01/16/2012   AST 16 01/16/2012   ALKPHOS 93 01/16/2012   BILITOT 0.2* 01/16/2012     RADIOLOGY RESULTS:   Images and reports are reviewed. RUQ US 02/24/12  Gallbladder: No gallstones, gallbladder wall thickening, or pericholecystic fluid. Evaluation for a sonographic Murphy's sign is negative.   Common bile duct: Measures 2.7 mm in diameter and has a normal appearance    ASSESSMENT AND PLAN: Nicole [name] is a 31 y.o. female who presents to the office with long-standing abdominal pain, nausea and bloating.  She has been diagnosed with biliary dyskinesia with a HIDA scan (EF15%).  I have explained to the patient that the chances of getting rid of her abdominal pain are good but not definite.  I have discussed the risks of laparoscopic cholecystectomy, including leak, bleeding, infections and common bile duct injury.  She has elected to  proceed with surgery.  We will schedule this electively at her convenience.      Shanara Schnieders C Vannie Hilgert, MD Colon and Rectal Surgery / General Surgery [facility]      Visit Diagnoses: 1. Biliary dyskinesia     Primary Care Physician: AVBUERE,EDWIN A, MD     

## 2012-05-04 NOTE — Anesthesia Procedure Notes (Signed)
Procedure Name: Intubation Date/Time: 05/04/2012 7:37 AM Performed by: Lora Chavers D Pre-anesthesia Checklist: Patient identified, Emergency Drugs available, Suction available and Patient being monitored Patient Re-evaluated:Patient Re-evaluated prior to inductionOxygen Delivery Method: Circle System Utilized Preoxygenation: Pre-oxygenation with 100% oxygen Intubation Type: IV induction Ventilation: Mask ventilation without difficulty Laryngoscope Size: Mac and 3 Grade View: Grade III Tube type: Oral Tube size: 7.0 mm Number of attempts: 2 Airway Equipment and Method: stylet,  LTA kit utilized and Video-laryngoscopy Placement Confirmation: ETT inserted through vocal cords under direct vision,  positive ETCO2 and breath sounds checked- equal and bilateral Secured at: 21 cm Tube secured with: Tape Dental Injury: Teeth and Oropharynx as per pre-operative assessment  Comments: Unable to obtain adequate visualization with first attempt, vocal cords visualized easily with glide scope

## 2012-05-04 NOTE — Transfer of Care (Signed)
Immediate Anesthesia Transfer of Care Note  Patient: Nicole [name]  Procedure(s) Performed: Procedure(s) (LRB) with comments: LAPAROSCOPIC CHOLECYSTECTOMY WITH INTRAOPERATIVE CHOLANGIOGRAM (N/A) - Laparoscopic cholecysectomy with intraoperative cholangiogram  Patient Location: PACU  Anesthesia Type: General  Level of Consciousness: awake, alert , oriented and patient cooperative  Airway & Oxygen Therapy: Patient Spontanous Breathing and Patient connected to face mask oxygen  Post-op Assessment: Report given to PACU RN and Post -op Vital signs reviewed and stable  Post vital signs: Reviewed and stable  Complications: No apparent anesthesia complications

## 2012-05-04 NOTE — Anesthesia Postprocedure Evaluation (Signed)
Anesthesia Post Note  Patient: Nicole [name]  Procedure(s) Performed: Procedure(s) (LRB): LAPAROSCOPIC CHOLECYSTECTOMY WITH INTRAOPERATIVE CHOLANGIOGRAM (N/A)  Anesthesia type: General  Patient location: PACU  Post pain: Pain level controlled and Adequate analgesia  Post assessment: Post-op Vital signs reviewed, Patient's Cardiovascular Status Stable, Respiratory Function Stable, Patent Airway and Pain level controlled  Last Vitals:  Filed Vitals:   05/04/12 0918  BP: 146/93  Pulse: 88  Temp:   Resp: 26    Post vital signs: Reviewed and stable  Level of consciousness: awake, alert  and oriented  Complications: No apparent anesthesia complications

## 2012-05-04 NOTE — Interval H&P Note (Signed)
History and Physical Interval Note:  05/04/2012 7:17 AM  Nicole [name]  has presented today for surgery, with the diagnosis of biliary Dyskinesia  The various methods of treatment have been discussed with the patient and family. After consideration of risks, benefits and other options for treatment, the patient has consented to  Procedure(s) (LRB) with comments: LAPAROSCOPIC CHOLECYSTECTOMY WITH INTRAOPERATIVE CHOLANGIOGRAM (N/A) - laparoscopic cholecysectomy with intraoperative cholangiogram as a surgical intervention .  The patient's history has been reviewed, patient examined, no change in status, stable for surgery.  I have reviewed the patient's chart and labs.  Questions were answered to the patient's satisfaction.     [name]

## 2012-05-05 ENCOUNTER — Encounter (INDEPENDENT_AMBULATORY_CARE_PROVIDER_SITE_OTHER): Payer: Self-pay

## 2012-05-05 ENCOUNTER — Encounter (HOSPITAL_BASED_OUTPATIENT_CLINIC_OR_DEPARTMENT_OTHER): Payer: Self-pay | Admitting: General Surgery

## 2012-05-15 ENCOUNTER — Ambulatory Visit (INDEPENDENT_AMBULATORY_CARE_PROVIDER_SITE_OTHER): Payer: Medicaid Other | Admitting: General Surgery

## 2012-05-15 ENCOUNTER — Encounter (INDEPENDENT_AMBULATORY_CARE_PROVIDER_SITE_OTHER): Payer: Self-pay | Admitting: General Surgery

## 2012-05-15 VITALS — BP 140/82 | HR 81 | Temp 98.4°F | Resp 20 | Ht 66.0 in | Wt 226.0 lb

## 2012-05-15 DIAGNOSIS — Z8719 Personal history of other diseases of the digestive system: Secondary | ICD-10-CM

## 2012-05-15 NOTE — Patient Instructions (Signed)
No heavy lifting for 6 more weeks.  You can take tylenol or ibuprofen for your pain and use the pain pills if you need something extra but try to wean yourself off of these.    You can take Colace 100mg  2 or 3 times a day to help with your constipation.  Be sure to drink plenty of water with this.

## 2012-05-15 NOTE — Progress Notes (Signed)
Nicole [name] is a 31 y.o. female who is status post a laparoscopic cholecystectomy and umbilical hernia repair on 9/23.  She is doing well.  Her RUQ pain is better.  She c/o pain only at umbilical site.  This gets worse when she is more active.  She denies any fevers, nausea or vomiting.  She is having some constipation.  Objective: Filed Vitals:   05/15/12 1648  BP: 140/82  Pulse: 81  Temp: 98.4 F (36.9 C)  Resp: 20    General appearance: alert, cooperative and no distress GI: soft, tender to palpation at umbilical site, no hernia noted  Incision: healing well   Assessment: s/p  Patient Active Problem List  Diagnosis  . NEUROPATHY-PERIPHERAL  . ALLERGIC RHINITIS  . NEPHROLITHIASIS  . ABNORMAL BLOOD CHEMISTRY NEC      Plan: I have instructed her to work on tapering her narcotics and substitute with Ibuprofen or Tylenol.   She will start Colace for her constipation Since she is still having pain, I will see her back in 4 weeks    .Vanita Panda, MD [facility] [place] [phone]   05/15/2012 5:25 PM

## 2012-05-26 ENCOUNTER — Encounter (INDEPENDENT_AMBULATORY_CARE_PROVIDER_SITE_OTHER): Payer: Medicaid Other | Admitting: General Surgery

## 2012-06-01 ENCOUNTER — Telehealth: Payer: Self-pay | Admitting: Obstetrics and Gynecology

## 2012-06-01 NOTE — Telephone Encounter (Signed)
Tc from pt per telephone call. Pt c/o daily spotting since 05/25/12. Pt with Mirena insertion in 02/2012 per pt. No abd pain or fever. Informed pt may have irregular bldg 3-6 mo after insertion. Pt states,"this is the first month the spotting has been prolonged". Advised pt to take a preg test. Pt will cont to monitor spotting and cb by end of week if spotting is still present. Pt will sched an appt for eval at that time. Pt voices understanding.

## 2012-06-01 NOTE — Telephone Encounter (Signed)
Lm on vm to cb per telephone call.  

## 2013-05-05 IMAGING — NM NM HEPATO W/GB/PHARM/[PERSON_NAME]
3 series · 13 of 13 positions shown · non-contrast
Comparison: Ultrasound 03/27/2012

CLINICAL DATA: Epigastric pain.

NUCLEAR MEDICINE HEPATOBILIARY IMAGING WITH GALLBLADDER EF
TECHNIQUE: Sequential images of the abdomen were obtained [DATE] minutes following intravenous administration of
radiopharmaceutical. After oral ingestion of Ensure, gallbladder
ejection fraction was determined.
Radiopharmaceutical:  5.5 mCi Gc-SSm Choletec

[he hepatobiliary · 3.43mm/px · 6 of 30 frames shown (1 of 3)]
[frame 3/30]
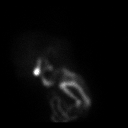
[frame 8/30]
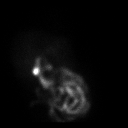
[frame 13/30]
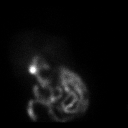
[frame 18/30]
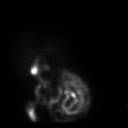
[frame 23/30]
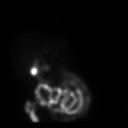
[frame 28/30]
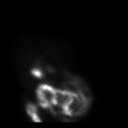

[he hepatobiliary · 1 of 1 slices shown (2 of 3)]
[im 1/1]
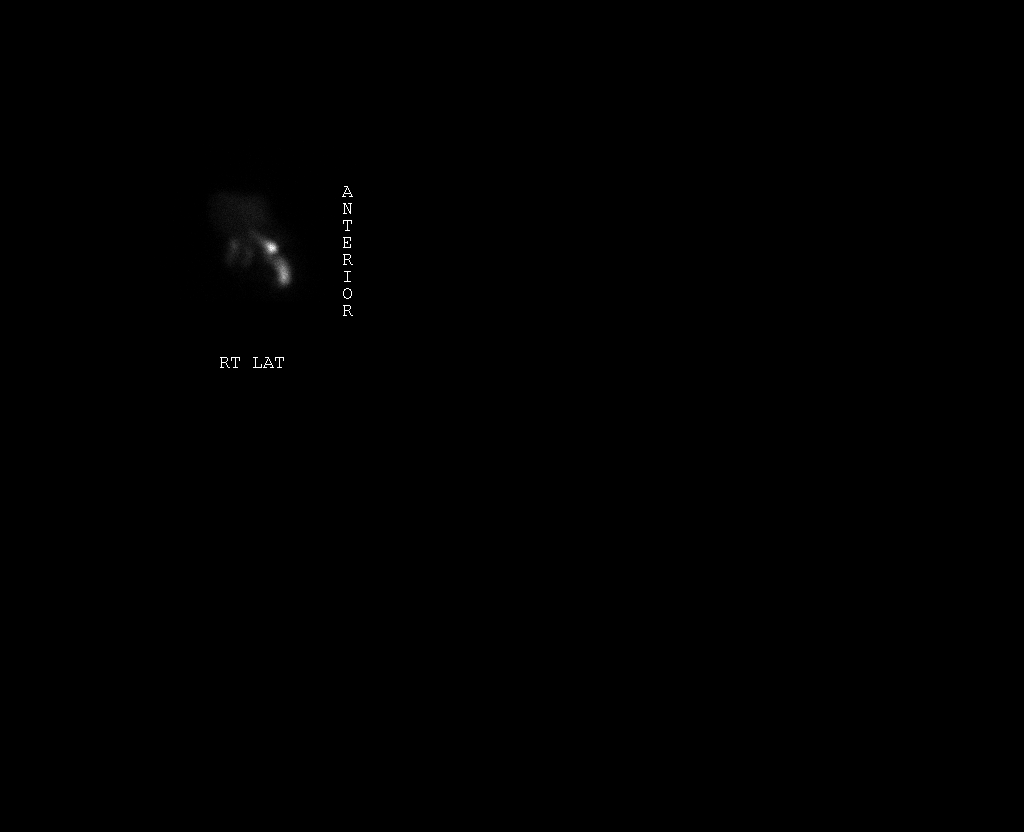

[he hepatobiliary · 3.43mm/px · 6 of 60 frames shown (3 of 3)]
[frame 6/60]
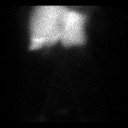
[frame 16/60]
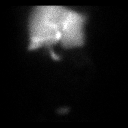
[frame 26/60]
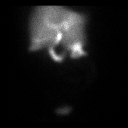
[frame 36/60]
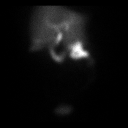
[frame 46/60]
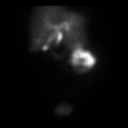
[frame 56/60]
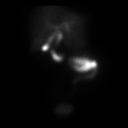

[13 of 13 positions shown; findings below may reference images not displayed]

FINDINGS: There is homogeneous uptake of radiotracer in the liver.
Counts are evident within the bowel by 15 minutes.  The gallbladder
begins to fill by 15 minutes.  At 16 minutes, a fatty meal was
administered.  The gallbladder contracted minimally with a
calculated ejection fraction equal 15%.

Gallbladder ejection fraction:  15%. Normal gallbladder ejection
fraction with Ensure is greater than 33%.
IMPRESSION: 1.. Low gallbladder ejection fraction =  15%. Common differential
diagnosis includes biliary dyskinesia, chronic cholecystitis, and
sphincter Oddi dysfunction.
2.  Patent cystic duct and common bile duct.

.

## 2014-04-22 ENCOUNTER — Encounter (HOSPITAL_COMMUNITY): Payer: Self-pay | Admitting: Emergency Medicine

## 2014-04-22 ENCOUNTER — Emergency Department (HOSPITAL_COMMUNITY)
Admission: EM | Admit: 2014-04-22 | Discharge: 2014-04-23 | Disposition: A | Discharge status: Home / Self Care | Payer: 59 | Attending: Emergency Medicine | Admitting: Emergency Medicine

## 2014-04-22 DIAGNOSIS — Z8742 Personal history of other diseases of the female genital tract: Secondary | ICD-10-CM | POA: Diagnosis not present

## 2014-04-22 DIAGNOSIS — I10 Essential (primary) hypertension: Secondary | ICD-10-CM | POA: Insufficient documentation

## 2014-04-22 DIAGNOSIS — Z3202 Encounter for pregnancy test, result negative: Secondary | ICD-10-CM | POA: Diagnosis not present

## 2014-04-22 DIAGNOSIS — J45909 Unspecified asthma, uncomplicated: Secondary | ICD-10-CM | POA: Diagnosis not present

## 2014-04-22 DIAGNOSIS — R1012 Left upper quadrant pain: Secondary | ICD-10-CM | POA: Diagnosis not present

## 2014-04-22 DIAGNOSIS — Z79899 Other long term (current) drug therapy: Secondary | ICD-10-CM | POA: Diagnosis not present

## 2014-04-22 DIAGNOSIS — Z862 Personal history of diseases of the blood and blood-forming organs and certain disorders involving the immune mechanism: Secondary | ICD-10-CM | POA: Insufficient documentation

## 2014-04-22 DIAGNOSIS — Z87442 Personal history of urinary calculi: Secondary | ICD-10-CM | POA: Diagnosis not present

## 2014-04-22 DIAGNOSIS — R109 Unspecified abdominal pain: Secondary | ICD-10-CM

## 2014-04-22 DIAGNOSIS — Z8619 Personal history of other infectious and parasitic diseases: Secondary | ICD-10-CM | POA: Diagnosis not present

## 2014-04-22 DIAGNOSIS — Z8659 Personal history of other mental and behavioral disorders: Secondary | ICD-10-CM | POA: Insufficient documentation

## 2014-04-22 DIAGNOSIS — R1032 Left lower quadrant pain: Secondary | ICD-10-CM | POA: Diagnosis present

## 2014-04-22 DIAGNOSIS — K219 Gastro-esophageal reflux disease without esophagitis: Secondary | ICD-10-CM | POA: Insufficient documentation

## 2014-04-22 LAB — CBC WITH DIFFERENTIAL/PLATELET
BASOS ABS: 0 10*3/uL (ref 0.0–0.1)
Basophils Relative: 0 % (ref 0–1)
EOS ABS: 0.1 10*3/uL (ref 0.0–0.7)
Eosinophils Relative: 1 % (ref 0–5)
HCT: 35.9 % — ABNORMAL LOW (ref 36.0–46.0)
Hemoglobin: 11.7 g/dL — ABNORMAL LOW (ref 12.0–15.0)
LYMPHS ABS: 3.2 10*3/uL (ref 0.7–4.0)
LYMPHS PCT: 34 % (ref 12–46)
MCH: 26.5 pg (ref 26.0–34.0)
MCHC: 32.6 g/dL (ref 30.0–36.0)
MCV: 81.2 fL (ref 78.0–100.0)
Monocytes Absolute: 0.8 10*3/uL (ref 0.1–1.0)
Monocytes Relative: 9 % (ref 3–12)
NEUTROS PCT: 56 % (ref 43–77)
Neutro Abs: 5.3 10*3/uL (ref 1.7–7.7)
PLATELETS: 230 10*3/uL (ref 150–400)
RBC: 4.42 MIL/uL (ref 3.87–5.11)
RDW: 14 % (ref 11.5–15.5)
WBC: 9.4 10*3/uL (ref 4.0–10.5)

## 2014-04-22 LAB — URINALYSIS, ROUTINE W REFLEX MICROSCOPIC
BILIRUBIN URINE: NEGATIVE
Glucose, UA: NEGATIVE mg/dL
Hgb urine dipstick: NEGATIVE
Ketones, ur: NEGATIVE mg/dL
Leukocytes, UA: NEGATIVE
NITRITE: NEGATIVE
PROTEIN: NEGATIVE mg/dL
SPECIFIC GRAVITY, URINE: 1.016 (ref 1.005–1.030)
UROBILINOGEN UA: 0.2 mg/dL (ref 0.0–1.0)
pH: 5.5 (ref 5.0–8.0)

## 2014-04-22 LAB — COMPREHENSIVE METABOLIC PANEL
ALT: 12 U/L (ref 0–35)
AST: 16 U/L (ref 0–37)
Albumin: 3.4 g/dL — ABNORMAL LOW (ref 3.5–5.2)
Alkaline Phosphatase: 87 U/L (ref 39–117)
Anion gap: 9 (ref 5–15)
BUN: 10 mg/dL (ref 6–23)
CALCIUM: 9.4 mg/dL (ref 8.4–10.5)
CO2: 25 meq/L (ref 19–32)
CREATININE: 0.6 mg/dL (ref 0.50–1.10)
Chloride: 105 mEq/L (ref 96–112)
GLUCOSE: 89 mg/dL (ref 70–99)
Potassium: 4.3 mEq/L (ref 3.7–5.3)
Sodium: 139 mEq/L (ref 137–147)
Total Protein: 7.3 g/dL (ref 6.0–8.3)

## 2014-04-22 LAB — LIPASE, BLOOD: LIPASE: 36 U/L (ref 11–59)

## 2014-04-22 LAB — POC URINE PREG, ED: PREG TEST UR: NEGATIVE

## 2014-04-22 NOTE — ED Notes (Signed)
Pt c/o LLQ pain onset yesterday, today radiating to L flank. Pt states hemorrhoids have increased, +nausea.

## 2014-04-23 ENCOUNTER — Emergency Department (HOSPITAL_COMMUNITY): Payer: 59

## 2014-04-23 MED ORDER — ONDANSETRON 4 MG PO TBDP: 4.0000 mg | ORAL_TABLET | Freq: Three times a day (TID) | ORAL | Status: DC | PRN

## 2014-04-23 MED ORDER — ONDANSETRON 4 MG PO TBDP
4.0000 mg | ORAL_TABLET | Freq: Once | ORAL | Status: AC
  Administered 2014-04-23: 4 mg via ORAL
  Filled 2014-04-23: qty 1

## 2014-04-23 MED ORDER — HYDROCODONE-ACETAMINOPHEN 5-325 MG PO TABS: ORAL_TABLET | ORAL | Status: DC

## 2014-04-23 MED ORDER — DICYCLOMINE HCL 20 MG PO TABS: 20.0000 mg | ORAL_TABLET | Freq: Two times a day (BID) | ORAL | Status: DC

## 2014-04-23 MED ORDER — KETOROLAC TROMETHAMINE 60 MG/2ML IM SOLN
60.0000 mg | Freq: Once | INTRAMUSCULAR | Status: AC
  Administered 2014-04-23: 60 mg via INTRAMUSCULAR
  Filled 2014-04-23: qty 2

## 2014-04-23 MED ORDER — ONDANSETRON HCL 4 MG/2ML IJ SOLN
4.0000 mg | Freq: Once | INTRAMUSCULAR | Status: DC
  Filled 2014-04-23: qty 2

## 2014-04-23 MED ORDER — KETOROLAC TROMETHAMINE 30 MG/ML IJ SOLN
30.0000 mg | Freq: Once | INTRAMUSCULAR | Status: DC
  Filled 2014-04-23: qty 1

## 2014-04-23 MED ORDER — HYDROMORPHONE HCL PF 2 MG/ML IJ SOLN
2.0000 mg | Freq: Once | INTRAMUSCULAR | Status: AC
  Administered 2014-04-23: 2 mg via INTRAMUSCULAR
  Filled 2014-04-23: qty 1

## 2014-04-23 NOTE — ED Provider Notes (Signed)
Medical screening examination/treatment/procedure(s) were performed by non-physician practitioner and as supervising physician I was immediately available for consultation/collaboration.   EKG Interpretation None       Kateena Degroote M Quetzal Meany, MD 04/23/14 0628 

## 2014-04-23 NOTE — Discharge Instructions (Signed)
Please read and follow all provided instructions.  Your diagnoses today include:  1. Left flank pain     Tests performed today include:  Blood counts and electrolytes  Blood tests to check liver and kidney function  Blood tests to check pancreas function  Urine test to look for infection and pregnancy (in women)  CT scan - shows small stone that is inside your kidney, otherwise normal  Vital signs. See below for your results today.   Medications prescribed:   Vicodin (hydrocodone/acetaminophen) - narcotic pain medication  DO NOT drive or perform any activities that require you to be awake and alert because this medicine can make you drowsy. BE VERY CAREFUL not to take multiple medicines containing Tylenol (also called acetaminophen). Doing so can lead to an overdose which can damage your liver and cause liver failure and possibly death.   Zofran (ondansetron) - for nausea and vomiting   Bentyl - medication for intestinal spasm  Take any prescribed medications only as directed.  Home care instructions:   Follow any educational materials contained in this packet.  Follow-up instructions: Please follow-up with your primary care provider in the next 3 days for further evaluation of your symptoms.    Return instructions:  SEEK IMMEDIATE MEDICAL ATTENTION IF:  The pain does not go away or becomes severe   A temperature above 101F develops   Repeated vomiting occurs (multiple episodes)   The pain becomes localized to portions of the abdomen. The right side could possibly be appendicitis. In an adult, the left lower portion of the abdomen could be colitis or diverticulitis.   Blood is being passed in stools or vomit (bright red or black tarry stools)   You develop chest pain, difficulty breathing, dizziness or fainting, or become confused, poorly responsive, or inconsolable (young children)  If you have any other emergent concerns regarding your health  Additional  Information: Abdominal (belly) pain can be caused by many things. Your caregiver performed an examination and possibly ordered blood/urine tests and imaging (CT scan, x-rays, ultrasound). Many cases can be observed and treated at home after initial evaluation in the emergency department. Even though you are being discharged home, abdominal pain can be unpredictable. Therefore, you need a repeated exam if your pain does not resolve, returns, or worsens. Most patients with abdominal pain don't have to be admitted to the hospital or have surgery, but serious problems like appendicitis and gallbladder attacks can start out as nonspecific pain. Many abdominal conditions cannot be diagnosed in one visit, so follow-up evaluations are very important.  Your vital signs today were: BP 114/63   Pulse 67   Temp(Src) 97.7 F (36.5 C) (Oral)   Resp 15   Ht  (1.702 m)   Wt 218 lb (98.884 kg)   BMI 34.14 kg/m2   SpO2 100%   LMP 03/25/2014 If your blood pressure (bp) was elevated above 135/85 this visit, please have this repeated by your doctor within one month. --------------

## 2014-04-23 NOTE — ED Provider Notes (Signed)
CSN: [id number]     Arrival date & time 04/22/14  2140 History   First MD Initiated Contact with Patient 04/23/14 0110     Chief Complaint  Patient presents with  . Abdominal Pain     (Consider location/radiation/quality/duration/timing/severity/associated sxs/prior Treatment) HPI Comments: Patient with history of cholecystectomy, lithotripsy due to kidney stones-presents with complaint of left lower quadrant abdominal pain and left flank pain with associated nausea, bloating for one to 2 days. Patient states that the pain is sharp and is waxing and waning in nature. It reminds her of previous kidney stone type of pain. She has had nausea but no vomiting. She has had soft stools and frequent feeling of needing to have a bowel movement. No blood in stool. No vaginal bleeding or discharge. No dysuria or hematuria. Patient took ibuprofen prior to arrival but this did not help pain. Onset of symptoms acute. Course is intermittent. Nothing makes symptoms better or worse.  Patient is a 33 y.o. female presenting with abdominal pain. The history is provided by the patient and medical records.  Abdominal Pain Associated symptoms: nausea   Associated symptoms: no chest pain, no cough, no diarrhea, no dysuria, no fever, no hematuria, no sore throat and no vomiting     Past Medical History  Diagnosis Date  . Asthma   . Hypertension   . Kidney stone   . GERD (gastroesophageal reflux disease)   . Depression 04/13/2009  . Kidney stones   . Pregnancy induced hypertension 03/10/2009  . Trichomonas 2005  . History of kidney stones       x 3  . H/O migraine   . Anemia   . H/O endometritis 03/20/09  . Blues 04/05/09  . Wears glasses   . Abdominal pain    Past Surgical History  Procedure Laterality Date  . Lithotripsy    . Kidney stone surgery  2005 & 2007  . Therapeutic abortion  2000  . Wisdom tooth extraction  2005 & 2007  . Surgery on left leg      2009  . Cholecystectomy  05/04/2012     Procedure: LAPAROSCOPIC CHOLECYSTECTOMY WITH INTRAOPERATIVE CHOLANGIOGRAM;  Surgeon: Romie Levee, MD;  Location: [facility];  Service: General;  Laterality: N/A;  Laparoscopic cholecysectomy with intraoperative cholangiogram   Family History  Problem Relation Age of Onset  . Hypertension Mother   . Cancer Sister     Leukemia  . Alcohol abuse Brother    History  Substance Use Topics  . Smoking status: Never Smoker   . Smokeless tobacco: Never Used  . Alcohol Use: No   OB History   Grav Para Term Preterm Abortions TAB SAB Ect Mult Living   Review of Systems  Constitutional: Negative for fever.  HENT: Negative for rhinorrhea and sore throat.   Eyes: Negative for redness.  Respiratory: Negative for cough.   Cardiovascular: Negative for chest pain.  Gastrointestinal: Positive for nausea and abdominal pain. Negative for vomiting, diarrhea and blood in stool.  Genitourinary: Positive for flank pain. Negative for dysuria, frequency and hematuria.  Musculoskeletal: Negative for myalgias.  Skin: Negative for rash.  Neurological: Negative for headaches.    Allergies  Oxycodone  Home Medications   Prior to Admission medications   Medication Sig Start Date End Date Taking? Authorizing Provider  losartan (COZAAR) 50 MG tablet Take 50 mg by mouth daily.   Yes Historical Provider, MD  omeprazole (PRILOSEC OTC) 20 MG tablet Take 20 mg by mouth daily.   Yes Historical Provider, MD   BP 131/65  Pulse 60  Temp(Src) 97.5 F (36.4 C) (Oral)  Resp 18  Ht  (1.702 m)  Wt 218 lb (98.884 kg)  BMI 34.14 kg/m2  SpO2 100%  LMP 03/25/2014  Physical Exam  Nursing note and vitals reviewed. Constitutional: She appears well-developed and well-nourished.  HENT:  Head: Normocephalic and atraumatic.  Eyes: Conjunctivae are normal. Right eye exhibits no discharge. Left eye exhibits no discharge.  Neck: Normal range of motion. Neck supple.   Cardiovascular: Normal rate, regular rhythm and normal heart sounds.   Pulmonary/Chest: Effort normal and breath sounds normal.  Abdominal: Soft. There is tenderness (mild) in the left upper quadrant and left lower quadrant. There is no rigidity, no rebound, no guarding, no CVA tenderness, no tenderness at McBurney's point and negative Murphy's sign.  Neurological: She is alert.  Skin: Skin is warm and dry.  Psychiatric: She has a normal mood and affect.    ED Course  Procedures (including critical care time) Labs Review Labs Reviewed  COMPREHENSIVE METABOLIC PANEL - Abnormal; Notable for the following:    Albumin 3.4 (*)    Total Bilirubin <0.2 (*)    All other components within normal limits  CBC WITH DIFFERENTIAL - Abnormal; Notable for the following:    Hemoglobin 11.7 (*)    HCT 35.9 (*)    All other components within normal limits  LIPASE, BLOOD  URINALYSIS, ROUTINE W REFLEX MICROSCOPIC  POC URINE PREG, ED    Imaging Review Ct Renal Stone Study  04/23/2014   CLINICAL DATA:  Left flank pain  EXAM: CT ABDOMEN AND PELVIS WITHOUT CONTRAST  TECHNIQUE: Multidetector CT imaging of the abdomen and pelvis was performed following the standard protocol without IV contrast.  COMPARISON:  Prior CT from 05/20/2006.  FINDINGS: The visualized lung bases are clear.  Subcapsular subcentimeter hypodensity within the inferior right hepatic lobe is too small the characterize, but may represent a small cyst. Liver is otherwise unremarkable. Gallbladder is absent. No biliary dilatation. The spleen, adrenal glands, and pancreas demonstrate a normal unenhanced appearance.  A small punctate calcifications noted within the lower pole of the left kidney (series 2, image 34). Finding is suspicious for a small nonobstructive stone. No other definite calculi identified. There is no left-sided hydronephrosis or hydroureter.  Right kidney within normal limits without evidence of nephrolithiasis or hydronephrosis.  No stones seen along the course of the right renal collecting system. There is no right-sided hydroureter.  Stomach within normal limits. No evidence of bowel obstruction. No abnormal wall thickening or inflammatory changes seen about the bowels. Appendix is within normal limits without evidence acute appendicitis.  Bladder within normal limits. IUD in place within the uterus. Ovaries unremarkable.  There is dye stasis of the rectus abdominis musculature without frank hernia. No free air or fluid. No pathologically enlarged intra-abdominal pelvic lymph nodes.  No acute osseous abnormality. No worrisome lytic or blastic osseous lesions.  IMPRESSION: 1. Punctate calcification within the lower pole of the left kidney, likely a small nonobstructive stone. No CT evidence of obstructive uropathy. 2. No other acute intra-abdominal or pelvic abnormality identified.   Electronically Signed   By: Rise Mu M.D.   On: 04/23/2014 05:05     EKG Interpretation None      1:48 AM Patient seen and examined. Lab work-up unremarkable. Will attempt to control patient's pain. Her exam  is not that impressive. Do not feel that imaging is necessarily indicated at this time. Medications ordered.   Vital signs reviewed and are as follows: BP 131/65  Pulse 60  Temp(Src) 97.5 F (36.4 C) (Oral)  Resp 18  Ht  (1.702 m)  Wt 218 lb (98.884 kg)  BMI 34.14 kg/m2  SpO2 100%  LMP 03/25/2014  3:24 AM Pt continues to have pain despite toradol. She is requesting additional pain medication. Will get renal stone study to eval for ureteral stone given her history.   6:03 AM Patient feels better. Informed of CT scan results. She has PCP and I have encouraged her to f/u early next week.   Will give medications for symptoms including Vicodin, Bentyl, Zofran.   The patient was urged to return to the Emergency Department immediately with worsening of current symptoms, worsening abdominal pain, persistent vomiting,  blood noted in stools, fever, or any other concerns. The patient verbalized understanding.    MDM   Final diagnoses:  Left flank pain   Pt with unclear etiology of her abdominal pain. Treated in the ED with improvement. Labs and urine are unremarkable. CT scan shows intrarenal stone but no obstructive stone. No other CT findings which would require emergent intervention. Will treat symptomatically and have patient follow up with her primary care physician. Patient and I discussed symptoms which would indicate need to return to the emergency department for further evaluation. Vitals are stable, no fever. No signs of dehydration, tolerating PO's. Lungs are clear. No focal abdominal pain, no concern for appendicitis, cholecystitis, pancreatitis, ruptured viscus, UTI, kidney stone, or any other emergent abdominal etiology. Supportive therapy indicated with return if symptoms worsen.     Renne Crigler, PA-C 04/23/14 [phone]

## 2014-06-13 ENCOUNTER — Encounter (HOSPITAL_COMMUNITY): Payer: Self-pay | Admitting: Emergency Medicine

## 2015-02-08 ENCOUNTER — Other Ambulatory Visit: Payer: Self-pay | Admitting: Physician Assistant

## 2015-02-08 ENCOUNTER — Inpatient Hospital Stay (HOSPITAL_COMMUNITY)
Admission: AD | Admit: 2015-02-08 | Discharge: 2015-02-08 | Disposition: A | Discharge status: Home / Self Care | Payer: 59 | Source: Ambulatory Visit | Attending: Obstetrics & Gynecology | Admitting: Obstetrics & Gynecology

## 2015-02-08 ENCOUNTER — Encounter (HOSPITAL_COMMUNITY): Payer: Self-pay

## 2015-02-08 DIAGNOSIS — N390 Urinary tract infection, site not specified: Secondary | ICD-10-CM | POA: Diagnosis not present

## 2015-02-08 DIAGNOSIS — N924 Excessive bleeding in the premenopausal period: Secondary | ICD-10-CM

## 2015-02-08 DIAGNOSIS — R102 Pelvic and perineal pain: Secondary | ICD-10-CM | POA: Diagnosis present

## 2015-02-08 LAB — URINALYSIS, ROUTINE W REFLEX MICROSCOPIC
Bilirubin Urine: NEGATIVE
Glucose, UA: 100 mg/dL — AB
Ketones, ur: 15 mg/dL — AB
LEUKOCYTES UA: NEGATIVE
NITRITE: POSITIVE — AB
PH: 5 (ref 5.0–8.0)
Protein, ur: NEGATIVE mg/dL
SPECIFIC GRAVITY, URINE: 1.025 (ref 1.005–1.030)
Urobilinogen, UA: 4 mg/dL — ABNORMAL HIGH (ref 0.0–1.0)

## 2015-02-08 LAB — WET PREP, GENITAL
Clue Cells Wet Prep HPF POC: NONE SEEN
TRICH WET PREP: NONE SEEN
YEAST WET PREP: NONE SEEN

## 2015-02-08 LAB — URINE MICROSCOPIC-ADD ON

## 2015-02-08 LAB — POCT PREGNANCY, URINE: PREG TEST UR: NEGATIVE

## 2015-02-08 MED ORDER — CIPROFLOXACIN HCL 500 MG PO TABS: 500.0000 mg | ORAL_TABLET | Freq: Two times a day (BID) | ORAL | Status: DC

## 2015-02-08 NOTE — MAU Provider Note (Signed)
History     CSN: [id number]  Arrival date and time: 02/08/15 2139   First Provider Initiated Contact with Patient 02/08/15 2243      Chief Complaint  Patient presents with  . Abdominal Pain   HPI Comments: Nicole [name] is a 34 y.o. [id number] who presents today with pelvic pain. She states that the pain started a little over a week ago after she had a pap smear done. She states that she has an IUD, and since the pap smear the IUD feels "different". She has called her PCP about this concern, and has an appointment in the morning for an [place]. She would like to have the IUD looked at today. She rates her current pain 8/10. She took ibuprofen around 1500 today. She states that it helped a little with the pain. She has had some spotting. She has also had a "very small" amount of vaginal discharge.  Abdominal Pain This is a new problem. The current episode started 1 to 4 weeks ago. The onset quality is gradual. The problem occurs constantly. The problem has been unchanged. The pain is located in the suprapubic region. The pain is at a severity of 8/10. The quality of the pain is cramping. The abdominal pain does not radiate. Associated symptoms include dysuria. Pertinent negatives include no constipation, diarrhea, fever, frequency, nausea or vomiting.     Past Medical History  Diagnosis Date  . Asthma   . Hypertension   . Kidney stone   . GERD (gastroesophageal reflux disease)   . Depression 04/13/2009  . Kidney stones   . Pregnancy induced hypertension 03/10/2009  . Trichomonas 2005  . History of kidney stones       x 3  . H/O migraine   . Anemia   . H/O endometritis 03/20/09  . Blues 04/05/09  . Wears glasses   . Abdominal pain     Past Surgical History  Procedure Laterality Date  . Lithotripsy    . Kidney stone surgery  2005 & 2007  . Therapeutic abortion  2000  . Wisdom tooth extraction  2005 & 2007  . Surgery on left leg      2009  . Cholecystectomy  05/04/2012    Procedure:  LAPAROSCOPIC CHOLECYSTECTOMY WITH INTRAOPERATIVE CHOLANGIOGRAM;  Surgeon: Romie Levee, MD;  Location: [facility];  Service: General;  Laterality: N/A;  Laparoscopic cholecysectomy with intraoperative cholangiogram    Family History  Problem Relation Age of Onset  . Hypertension Mother   . Cancer Sister     Leukemia  . Alcohol abuse Brother     History  Substance Use Topics  . Smoking status: Never Smoker   . Smokeless tobacco: Never Used  . Alcohol Use: No    Allergies:  Allergies  Allergen Reactions  . Oxycodone Itching    Prescriptions prior to admission  Medication Sig Dispense Refill Last Dose  . dicyclomine (BENTYL) 20 MG tablet Take 1 tablet (20 mg total) by mouth 2 (two) times daily. 20 tablet 0   . HYDROcodone-acetaminophen (NORCO/VICODIN) 5-325 MG per tablet Take 1-2 tablets every 6 hours as needed for severe pain 10 tablet 0   . losartan (COZAAR) 50 MG tablet Take 50 mg by mouth daily.   04/21/2014 at Unknown time  . omeprazole (PRILOSEC OTC) 20 MG tablet Take 20 mg by mouth daily.   04/22/2014 at Unknown time  . ondansetron (ZOFRAN ODT) 4 MG disintegrating tablet Take 1 tablet (4 mg total) by mouth every  8 (eight) hours as needed for nausea or vomiting. 10 tablet 0     Review of Systems  Constitutional: Negative for fever.  Gastrointestinal: Positive for abdominal pain. Negative for nausea, vomiting, diarrhea and constipation.  Genitourinary: Positive for dysuria. Negative for urgency and frequency.   Physical Exam   Blood pressure 135/73, pulse 78, temperature 98.6 F (37 C), temperature source Oral, resp. rate 18, height 5\' 5"  (1.651 m), weight 98.431 kg (217 lb), SpO2 100 %.  Physical Exam  Nursing note and vitals reviewed. Constitutional: She is oriented to person, place, and time. She appears well-developed and well-nourished. No distress.  HENT:  Head: Normocephalic.  Cardiovascular: Normal rate.   Respiratory: Effort normal.  GI:  Soft. There is no tenderness.  Genitourinary:   External: no lesion Vagina: small amount of brown blood  Cervix: pink, smooth, no CMT, IUD tail seen at OS  Uterus: NSSC Adnexa: NT   Neurological: She is alert and oriented to person, place, and time.  Skin: Skin is warm and dry.  Psychiatric: She has a normal mood and affect.     Results for orders placed or performed during the hospital encounter of 02/08/15 (from the past 24 hour(s))  Urinalysis, Routine w reflex microscopic (not [facility])     Status: Abnormal   Collection Time: 02/08/15 10:10 PM  Result Value Ref Range   Color, Urine ORANGE (A) YELLOW   APPearance CLEAR CLEAR   Specific Gravity, Urine 1.025 1.005 - 1.030   pH 5.0 5.0 - 8.0   Glucose, UA 100 (A) NEGATIVE mg/dL   Hgb urine dipstick SMALL (A) NEGATIVE   Bilirubin Urine NEGATIVE NEGATIVE   Ketones, ur 15 (A) NEGATIVE mg/dL   Protein, ur NEGATIVE NEGATIVE mg/dL   Urobilinogen, UA 4.0 (H) 0.0 - 1.0 mg/dL   Nitrite POSITIVE (A) NEGATIVE   Leukocytes, UA NEGATIVE NEGATIVE  Urine microscopic-add on     Status: None   Collection Time: 02/08/15 10:10 PM  Result Value Ref Range   Squamous Epithelial / LPF RARE RARE   WBC, UA 0-2 <3 WBC/hpf   RBC / HPF 0-2 <3 RBC/hpf   Bacteria, UA RARE RARE  Pregnancy, urine POC     Status: None   Collection Time: 02/08/15 10:15 PM  Result Value Ref Range   Preg Test, Ur NEGATIVE NEGATIVE    MAU Course  Procedures  MDM   Assessment and Plan   1. UTI (lower urinary tract infection)    DC home Comfort measures reviewed  RX: cipro 500mg  BID x 5 days Return to MAU as needed FU with OB as planned Keep all previously scheduled appointments   Follow-up Information    Please follow up.   Why:  As scheduled   Contact information:   [facility]         Tawnya CrookHogan, Heather Donovan 02/08/2015, 10:44 PM

## 2015-02-08 NOTE — MAU Note (Signed)
Pt reports spotting as well for the last week and left lower abdominal cramping.

## 2015-02-08 NOTE — Discharge Instructions (Signed)

## 2015-02-08 NOTE — MAU Note (Signed)
Pt was seen on 06/15 and had a PAP, states she has had an IUD since 2013 but when she had the PAP it hurt and it has been hurting since. States she feels like the IUD is going to fall out.

## 2015-02-09 ENCOUNTER — Other Ambulatory Visit: Payer: Medicaid Other

## 2015-02-09 LAB — GC/CHLAMYDIA PROBE AMP (~~LOC~~) NOT AT ARMC
Chlamydia: NEGATIVE
Neisseria Gonorrhea: NEGATIVE

## 2015-02-10 LAB — URINE CULTURE: Culture: NO GROWTH

## 2015-05-19 ENCOUNTER — Encounter (HOSPITAL_COMMUNITY): Payer: Self-pay | Admitting: *Deleted

## 2015-05-19 ENCOUNTER — Emergency Department (HOSPITAL_COMMUNITY)
Admission: EM | Admit: 2015-05-19 | Discharge: 2015-05-20 | Disposition: A | Discharge status: Home / Self Care | Payer: Self-pay | Attending: Emergency Medicine | Admitting: Emergency Medicine

## 2015-05-19 DIAGNOSIS — Z87442 Personal history of urinary calculi: Secondary | ICD-10-CM | POA: Insufficient documentation

## 2015-05-19 DIAGNOSIS — M7918 Myalgia, other site: Secondary | ICD-10-CM

## 2015-05-19 DIAGNOSIS — Z8742 Personal history of other diseases of the female genital tract: Secondary | ICD-10-CM | POA: Insufficient documentation

## 2015-05-19 DIAGNOSIS — M791 Myalgia: Secondary | ICD-10-CM | POA: Insufficient documentation

## 2015-05-19 DIAGNOSIS — Z862 Personal history of diseases of the blood and blood-forming organs and certain disorders involving the immune mechanism: Secondary | ICD-10-CM | POA: Insufficient documentation

## 2015-05-19 DIAGNOSIS — I10 Essential (primary) hypertension: Secondary | ICD-10-CM | POA: Insufficient documentation

## 2015-05-19 DIAGNOSIS — Z9049 Acquired absence of other specified parts of digestive tract: Secondary | ICD-10-CM | POA: Insufficient documentation

## 2015-05-19 DIAGNOSIS — J45909 Unspecified asthma, uncomplicated: Secondary | ICD-10-CM | POA: Insufficient documentation

## 2015-05-19 DIAGNOSIS — Z8619 Personal history of other infectious and parasitic diseases: Secondary | ICD-10-CM | POA: Insufficient documentation

## 2015-05-19 DIAGNOSIS — Z79899 Other long term (current) drug therapy: Secondary | ICD-10-CM | POA: Insufficient documentation

## 2015-05-19 DIAGNOSIS — Z3202 Encounter for pregnancy test, result negative: Secondary | ICD-10-CM | POA: Insufficient documentation

## 2015-05-19 DIAGNOSIS — R52 Pain, unspecified: Secondary | ICD-10-CM

## 2015-05-19 DIAGNOSIS — R109 Unspecified abdominal pain: Secondary | ICD-10-CM | POA: Insufficient documentation

## 2015-05-19 DIAGNOSIS — K219 Gastro-esophageal reflux disease without esophagitis: Secondary | ICD-10-CM | POA: Insufficient documentation

## 2015-05-19 DIAGNOSIS — N898 Other specified noninflammatory disorders of vagina: Secondary | ICD-10-CM | POA: Insufficient documentation

## 2015-05-19 LAB — URINALYSIS, ROUTINE W REFLEX MICROSCOPIC
BILIRUBIN URINE: NEGATIVE
Glucose, UA: NEGATIVE mg/dL
HGB URINE DIPSTICK: NEGATIVE
Ketones, ur: NEGATIVE mg/dL
Leukocytes, UA: NEGATIVE
Nitrite: NEGATIVE
Protein, ur: NEGATIVE mg/dL
Specific Gravity, Urine: 1.023 (ref 1.005–1.030)
UROBILINOGEN UA: 1 mg/dL (ref 0.0–1.0)
pH: 7.5 (ref 5.0–8.0)

## 2015-05-19 NOTE — ED Notes (Signed)
Pt stated the pain started one week ago but has gotten progressively worse. Pt stated whenever she voids she has pain before and after. Pt stated the pain she has radiates to her back on the left side.

## 2015-05-20 ENCOUNTER — Emergency Department (HOSPITAL_COMMUNITY): Payer: 59

## 2015-05-20 ENCOUNTER — Encounter (HOSPITAL_COMMUNITY): Payer: Self-pay | Admitting: Emergency Medicine

## 2015-05-20 LAB — WET PREP, GENITAL
Clue Cells Wet Prep HPF POC: NONE SEEN
Trich, Wet Prep: NONE SEEN
Yeast Wet Prep HPF POC: NONE SEEN

## 2015-05-20 LAB — PREGNANCY, URINE: Preg Test, Ur: NEGATIVE

## 2015-05-20 MED ORDER — KETOROLAC TROMETHAMINE 60 MG/2ML IM SOLN
60.0000 mg | Freq: Once | INTRAMUSCULAR | Status: AC
  Administered 2015-05-20: 60 mg via INTRAMUSCULAR
  Filled 2015-05-20: qty 2

## 2015-05-20 MED ORDER — METHOCARBAMOL 500 MG PO TABS: 500.0000 mg | ORAL_TABLET | Freq: Two times a day (BID) | ORAL | Status: DC

## 2015-05-20 MED ORDER — MELOXICAM 7.5 MG PO TABS: 7.5000 mg | ORAL_TABLET | Freq: Every day | ORAL | Status: DC

## 2015-05-20 NOTE — Discharge Instructions (Signed)
Cryotherapy  Cryotherapy is when you put ice on your injury. Ice helps lessen pain and puffiness (swelling) after an injury. Ice works the best when you start using it in the first 24 to 48 hours after an injury.  HOME CARE  · Put a dry or damp towel between the ice pack and your skin.  · You may press gently on the ice pack.  · Leave the ice on for no more than 10 to 20 minutes at a time.  · Check your skin after 5 minutes to make sure your skin is okay.  · Rest at least 20 minutes between ice pack uses.  · Stop using ice when your skin loses feeling (numbness).  · Do not use ice on someone who cannot tell you when it hurts. This includes small children and people with memory problems (dementia).  GET HELP RIGHT AWAY IF:  · You have white spots on your skin.  · Your skin turns blue or pale.  · Your skin feels waxy or hard.  · Your puffiness gets worse.  MAKE SURE YOU:   · Understand these instructions.  · Will watch your condition.  · Will get help right away if you are not doing well or get worse.     This information is not intended to replace advice given to you by your health care provider. Make sure you discuss any questions you have with your health care provider.     Document Released: 01/15/2008 Document Revised: 10/21/2011 Document Reviewed: 03/21/2011  Elsevier Interactive Patient Education ©2016 [employer]

## 2015-05-20 NOTE — ED Notes (Signed)
Bed: WA16 Expected date:  Expected time:  Means of arrival:  Comments: 

## 2015-05-20 NOTE — ED Provider Notes (Signed)
CSN: [id number]     Arrival date & time 05/19/15  2200 History   By signing my name below, I, Arlan Organ, attest that this documentation has been prepared under the direction and in the presence of Paradise Vensel, MD. Electronically Signed: Arlan Organ, ED Scribe. 05/20/2015. 1:41 AM.   Chief Complaint  Patient presents with  . Flank Pain    Pt stated she has a hx of kidney stones . She presents with left sided flank pain radiating to the back-pain a 9/10   Patient is a 34 y.o. female presenting with abdominal pain. The history is provided by the patient. No language interpreter was used.  Abdominal Pain Pain location:  LLQ Pain quality: cramping   Pain radiates to:  L flank Pain severity:  Severe Onset quality:  Gradual Duration:  1 week Timing:  Constant Progression:  Worsening Chronicity:  New Context: not awakening from sleep and not trauma   Relieved by:  Nothing Worsened by:  Movement Ineffective treatments:  OTC medications Associated symptoms: no chest pain, no chills, no cough, no diarrhea, no dysuria, no fever, no hematuria, no nausea, no shortness of breath and no vomiting   Risk factors: has not had multiple surgeries     HPI Comments: Nicole [name] is a 34 y.o. female with a PMHx of HTN and kidney stones who presents to the Emergency Department complaining of constant, ongoing lower L sided abdominal pain that radiates to the L flank pain x 1 week; worsened today. Currently pain is rated 10/10. Denies any recent trauma. No heavy lifting. Discomfort is made worse with certain positions. No alleviating factors at this time. OTC Ibuprofen attempted at home without any improvement. No recent fever, chills, nausea, vomiting, chest pain, or shortness of breath. LNMP end of June- Pt has had Mirena for 2 years now. Denies any recent sexual encounters.  Past Medical History  Diagnosis Date  . Asthma   . Hypertension   . Kidney stone   . GERD (gastroesophageal reflux disease)    . Depression 04/13/2009  . Kidney stones   . Pregnancy induced hypertension 03/10/2009  . Trichomonas 2005  . History of kidney stones       x 3  . H/O migraine   . Anemia   . H/O endometritis 03/20/09  . Blues 04/05/09  . Wears glasses   . Abdominal pain    Past Surgical History  Procedure Laterality Date  . Lithotripsy    . Kidney stone surgery  2005 & 2007  . Therapeutic abortion  2000  . Wisdom tooth extraction  2005 & 2007  . Surgery on left leg      2009  . Cholecystectomy  05/04/2012    Procedure: LAPAROSCOPIC CHOLECYSTECTOMY WITH INTRAOPERATIVE CHOLANGIOGRAM;  Surgeon: Romie Levee, MD;  Location: [facility];  Service: General;  Laterality: N/A;  Laparoscopic cholecysectomy with intraoperative cholangiogram   Family History  Problem Relation Age of Onset  . Hypertension Mother   . Cancer Sister     Leukemia  . Alcohol abuse Brother    Social History  Substance Use Topics  . Smoking status: Never Smoker   . Smokeless tobacco: Never Used  . Alcohol Use: No   OB History    Gravida Para Term Preterm AB TAB SAB Ectopic Multiple Living   3 2 2  1 1    2      Review of Systems  Constitutional: Negative for fever and chills.  Respiratory: Negative for  cough and shortness of breath.   Cardiovascular: Negative for chest pain.  Gastrointestinal: Positive for abdominal pain. Negative for nausea, vomiting and diarrhea.  Genitourinary: Positive for flank pain. Negative for dysuria and hematuria.  Musculoskeletal: Negative for back pain.  Skin: Negative for rash.  Neurological: Negative for headaches.  Psychiatric/Behavioral: Negative for confusion.  All other systems reviewed and are negative.     Allergies  Oxycodone  Home Medications   Prior to Admission medications   Medication Sig Start Date End Date Taking? Authorizing Provider  losartan (COZAAR) 50 MG tablet Take 50 mg by mouth daily.   Yes Historical Provider, MD  ciprofloxacin (CIPRO) 500  MG tablet Take 1 tablet (500 mg total) by mouth 2 (two) times daily. Patient not taking: Reported on 05/20/2015 02/08/15   Armando Reichert, CNM  omeprazole (PRILOSEC OTC) 20 MG tablet Take 20 mg by mouth daily.    Historical Provider, MD  phenazopyridine (PYRIDIUM) 200 MG tablet Take 200 mg by mouth 3 (three) times daily as needed for pain.    Historical Provider, MD   Triage Vitals: BP 126/83 mmHg  Pulse 63  Temp(Src) 98 F (36.7 C)  Resp 16  SpO2 100%   Physical Exam  Constitutional: She is oriented to person, place, and time. She appears well-developed and well-nourished. No distress.  HENT:  Head: Normocephalic and atraumatic.  Mouth/Throat: Oropharynx is clear and moist. No oropharyngeal exudate.  Eyes: EOM are normal. Pupils are equal, round, and reactive to light.  Neck: Normal range of motion. Neck supple.  Cardiovascular: Normal rate, regular rhythm and normal heart sounds.  Exam reveals no gallop and no friction rub.   No murmur heard. Pulmonary/Chest: Effort normal and breath sounds normal. No respiratory distress. She has no wheezes. She has no rales.  Abdominal: Soft. Bowel sounds are normal. She exhibits no distension. There is no tenderness. There is no rebound and no guarding.  Genitourinary: Vaginal discharge found.  Small amount of brown discharge noted Os is closed No cyst on exam Chaperone present  Musculoskeletal: Normal range of motion.  Neurological: She is alert and oriented to person, place, and time.  Skin: Skin is warm and dry.  Psychiatric: She has a normal mood and affect. Judgment normal.  Nursing note and vitals reviewed.   ED Course  Procedures (including critical care time)  DIAGNOSTIC STUDIES: Oxygen Saturation is 100% on RA, Normal by my interpretation.    COORDINATION OF CARE: 1:26 AM- Will perform pelvic exam. Will order pregnancy urine and urinalysis. Discussed treatment plan with pt at bedside and pt agreed to plan.     Labs  Review Labs Reviewed  URINALYSIS, ROUTINE W REFLEX MICROSCOPIC (NOT AT [facility])  PREGNANCY, URINE    Imaging Review No results found. I have personally reviewed and evaluated these images and lab results as part of my medical decision-making.   EKG Interpretation None      MDM   Final diagnoses:  None    No stones, colitis. Appendix is normal.  Pain is worse with movement.  Will treat for MSK pain.  Follow up with your family doctor for ongoing care.  Strict abdominal pain return precautions given  I, Kenzey Birkland-RASCH,Dracen Reigle K, personally performed the services described in this documentation. All medical record entries made by the scribe were at my direction and in my presence.  I have reviewed the chart and discharge instructions and agree that the record reflects my personal performance and is accurate and complete. Ocean Kearley-RASCH,Drago Hammonds K.  05/20/2015. 5:07 AM.     Yao Hyppolite, MD 05/20/15 1610

## 2015-05-20 NOTE — ED Notes (Signed)
Pt c/o 10/10 left flank pain x 1 week.  Pain in lower abdomen with urination and associated nausea but no vomiting.  Hx of kidney stones; states that current symptoms seem the same.

## 2015-05-22 LAB — GC/CHLAMYDIA PROBE AMP (~~LOC~~) NOT AT ARMC
Chlamydia: NEGATIVE
Neisseria Gonorrhea: NEGATIVE

## 2015-10-23 ENCOUNTER — Ambulatory Visit: Payer: BLUE CROSS/BLUE SHIELD | Attending: Specialist | Admitting: Physical Therapy

## 2015-10-23 ENCOUNTER — Encounter: Payer: Self-pay | Admitting: Physical Therapy

## 2015-10-23 DIAGNOSIS — M5442 Lumbago with sciatica, left side: Secondary | ICD-10-CM

## 2015-10-23 DIAGNOSIS — M629 Disorder of muscle, unspecified: Secondary | ICD-10-CM

## 2015-10-23 NOTE — Patient Instructions (Signed)
Back Hyperextension: Using Arms   Lying face down with arms bent, inhale. Then while exhaling, straighten arms. Hold __2__ seconds. Let hips sag toward floor.  Slowly return to starting position. Repeat _10___ times per set. Do _2___ sets per session. Do __2__ sessions per day.  Back Namaste   Stand, feet in stride stance. Rotate back leg out about 20, keeping heel on floor. Stand, hands on low back. Press hips forward and arch back. Hold _2__ seconds. Repeat 10___ times per session. Do _2__ sessions per day.  Bracing With Bridging (Hook-Lying)   With neutral spine, tighten pelvic floor and abdominals and hold. Lift bottom. Repeat _10__ times. Do _2__ times a day.       

## 2015-10-23 NOTE — Therapy (Signed)
[facility] [address] [place], [place], [address] Phone: [phone]   Fax:  [phone]  Physical Therapy Evaluation  Patient Details  Name: Nicole [name] MRN: [id number] Date of Birth: 04-Sep-1980 Referring Provider: Otelia SergeantNitka  Encounter Date: 10/23/2015      PT End of Session - 10/23/15 0835    Visit Number 1   Date for PT Re-Evaluation 12/23/15   PT Start Time 0811   PT Stop Time 0900   PT Time Calculation (min) 49 min   Activity Tolerance Patient tolerated treatment well   Behavior During Therapy [facility] for tasks assessed/performed      Past Medical History  Diagnosis Date  . Asthma   . Hypertension   . Kidney stone   . GERD (gastroesophageal reflux disease)   . Depression 04/13/2009  . Kidney stones   . Pregnancy induced hypertension 03/10/2009  . Trichomonas 2005  . History of kidney stones       x 3  . H/O migraine   . Anemia   . H/O endometritis 03/20/09  . Blues 04/05/09  . Wears glasses   . Abdominal pain     Past Surgical History  Procedure Laterality Date  . Lithotripsy    . Kidney stone surgery  2005 & 2007  . Therapeutic abortion  2000  . Wisdom tooth extraction  2005 & 2007  . Surgery on left leg      2009  . Cholecystectomy  05/04/2012    Procedure: LAPAROSCOPIC CHOLECYSTECTOMY WITH INTRAOPERATIVE CHOLANGIOGRAM;  Surgeon: Romie LeveeAlicia Thomas, MD;  Location: [facility];  Service: General;  Laterality: N/A;  Laparoscopic cholecysectomy with intraoperative cholangiogram    There were no vitals filed for this visit.  Visit Diagnosis:  Left-sided low back pain with left-sided sciatica - Plan: PT plan of care cert/re-cert  Hamstring tightness of both lower extremities - Plan: PT plan of care cert/re-cert      Subjective Assessment - 10/23/15 0814    Subjective Patient reports that she started to have pain in December, she was working with [employer].  She reports that she remembers  something falling on her foot but pain is mostly in the left low back, buttock and down the left leg.  She is unsure if she was limping and if this caused the back issues.  She reports that medicine has helped some   Limitations Lifting;Standing;Walking;House hold activities   Patient Stated Goals have less pain   Currently in Pain? Yes   Pain Score 3    Pain Location Back   Pain Orientation Left;Lower   Pain Descriptors / Indicators Aching;Tingling   Pain Type Acute pain   Pain Onset More than a month ago   Pain Frequency Constant   Aggravating Factors  pain up to 9/10 with bending, reports cannot put socks on, difficulty lifting   Pain Relieving Factors rest, pain meds, and ibuprofen help decrease to at best a 3/10   Effect of Pain on Daily Activities difficulty with all ADL's            [facility] Assessment - 10/23/15 0001    Assessment   Medical Diagnosis low back pain iwth left sciatica   Referring Provider Nitka   Onset Date/Surgical Date 08/07/15   Prior Therapy no   Precautions   Precautions None   Balance Screen   Has the patient fallen in the past 6 months No   Has the patient had  a decrease in activity level because of a fear of falling?  No   Is the patient reluctant to leave their home because of a fear of falling?  No   Home Environment   Additional Comments does housework, has a 36 year old   Prior Function   Level of Independence Independent   Vocation Part time employment   Vocation Requirements worked at [employer] had to lift up to [employer]   Leisure no exercise   Posture/Postural Control   Posture Comments fwd head, rounded shoulders   AROM   Overall AROM Comments lumbar ROM was decreased 75% with pain in the left low back and buttock   Strength   Overall Strength Comments 4-/5 for the LE's, pain with MMT of the left leg   Flexibility   Soft Tissue Assessment /Muscle Length --  Tight HS, very tight piriformis mms with pain   Palpation   Palpation comment She  is very tender in the left lumbar area and into the left buttock and posterior/lateral thigh.  She has spasms in the lumbar area   Special Tests    Special Tests --  negative slump, + SLR on the left @ 60 degrees   Ambulation/Gait   Gait Comments has a mild antalgic gait on the left                   [facility] Adult PT Treatment/Exercise - 10/23/15 0001    Modalities   Modalities Moist Heat;Electrical Stimulation   Moist Heat Therapy   Number Minutes Moist Heat 15 Minutes   Moist Heat Location Lumbar Spine   Electrical Stimulation   Electrical Stimulation Location left lumbar   Electrical Stimulation Action IFC   Electrical Stimulation Parameters supine   Electrical Stimulation Goals Pain                PT Education - 10/23/15 0835    Education provided Yes   Education Details also gave piriformis stretch   Person(s) Educated Patient   Methods Explanation;Demonstration   Comprehension Verbalized understanding          PT Short Term Goals - 10/23/15 0838    PT SHORT TERM GOAL #1   Title independent with initial HEP   Time 1   Period Weeks   Status New           PT Long Term Goals - 10/23/15 1610    PT LONG TERM GOAL #1   Title understand proper posture and body mechanics   Time 8   Period Weeks   Status New   PT LONG TERM GOAL #2   Title decrease pain 50%   Time 8   Period Weeks   Status New   PT LONG TERM GOAL #3   Title increase lumbar ROM 50%   Time 8   Period Weeks   Status New   PT LONG TERM GOAL #4   Title be able to put on shoes and socks without help and without pain   Time 8   Period Weeks   Status New               Plan - 10/23/15 0836    Clinical Impression Statement Patient with left low back pain iwth sciatica, she is unsure of an exact cause, reports pain started in December, she was working for [employer], she reports an x-ray showed a fracture.  Has a very tight left piriformis mms, has difficulty putting on socks  due  to pain and decreased ROM   Pt will benefit from skilled therapeutic intervention in order to improve on the following deficits Decreased range of motion;Decreased strength;Increased muscle spasms;Impaired flexibility;Improper body mechanics;Pain   Rehab Potential Good   PT Frequency 2x / week   PT Duration 8 weeks   PT Treatment/Interventions ADLs/Self Care Home Management;Cryotherapy;Electrical Stimulation;Moist Heat;Therapeutic exercise;Therapeutic activities;Manual techniques;Patient/family education;Ultrasound;Traction   PT Next Visit Plan slowly add exercises   Consulted and Agree with Plan of Care Patient         Problem List Patient Active Problem List   Diagnosis Date Noted  . NEUROPATHY-PERIPHERAL 11/06/2006  . ALLERGIC RHINITIS 11/06/2006  . NEPHROLITHIASIS 11/06/2006  . ABNORMAL BLOOD CHEMISTRY NEC 11/06/2006    Jearld Lesch., PT 10/23/2015, 8:42 AM  [facility] [address] [place], [place], [address] Phone: [phone]   Fax:  [phone]  Name: Nicole [name] MRN: [id number] Date of Birth: 1980-12-28

## 2015-10-26 ENCOUNTER — Ambulatory Visit: Payer: BLUE CROSS/BLUE SHIELD | Admitting: Physical Therapy

## 2015-10-26 ENCOUNTER — Encounter: Payer: Self-pay | Admitting: Physical Therapy

## 2015-10-26 DIAGNOSIS — M5442 Lumbago with sciatica, left side: Secondary | ICD-10-CM

## 2015-10-26 NOTE — Therapy (Signed)
[facility] [address] [place], [place], [address] Phone: [phone]   Fax:  [phone]  Physical Therapy Treatment  Patient Details  Name: Nicole [name] MRN: [id number] Date of Birth: Dec 21, 1980 Referring Provider: Otelia Sergeant  Encounter Date: 10/26/2015      PT End of Session - 10/26/15 0908    Visit Number 2   Date for PT Re-Evaluation 12/23/15   PT Start Time 0807   PT Stop Time 0900   PT Time Calculation (min) 53 min      Past Medical History  Diagnosis Date  . Asthma   . Hypertension   . Kidney stone   . GERD (gastroesophageal reflux disease)   . Depression 04/13/2009  . Kidney stones   . Pregnancy induced hypertension 03/10/2009  . Trichomonas 2005  . History of kidney stones       x 3  . H/O migraine   . Anemia   . H/O endometritis 03/20/09  . Blues 04/05/09  . Wears glasses   . Abdominal pain     Past Surgical History  Procedure Laterality Date  . Lithotripsy    . Kidney stone surgery  2005 & 2007  . Therapeutic abortion  2000  . Wisdom tooth extraction  2005 & 2007  . Surgery on left leg      2009  . Cholecystectomy  05/04/2012    Procedure: LAPAROSCOPIC CHOLECYSTECTOMY WITH INTRAOPERATIVE CHOLANGIOGRAM;  Surgeon: Romie Levee, MD;  Location: [facility];  Service: General;  Laterality: N/A;  Laparoscopic cholecysectomy with intraoperative cholangiogram    There were no vitals filed for this visit.  Visit Diagnosis:  Left-sided low back pain with left-sided sciatica      Subjective Assessment - 10/26/15 0854    Subjective hurting today, doing ex/stretches at home with some pain   Currently in Pain? Yes   Pain Score 8    Pain Location Back   Pain Orientation Left                         [facility] Adult PT Treatment/Exercise - 10/26/15 0001    Exercises   Exercises Lumbar;Knee/Hip   Lumbar Exercises: Aerobic   Stationary Bike Nustep L 4 6 min   Lumbar  Exercises: Machines for Strengthening   Cybex Lumbar Extension blue tband 2 sets 10   Other Lumbar Machine Exercise seated row 15# 2 sets 10, lat pull 15# 2 sets 10   Lumbar Exercises: Supine   Ab Set 15 reps   Bridge 15 reps  with ball   Large Ball Oblique Isometric 20 reps   Other Supine Lumbar Exercises KTC with ball 15   Modalities   Modalities Moist Heat;Electrical Stimulation   Moist Heat Therapy   Number Minutes Moist Heat 15 Minutes   Moist Heat Location Lumbar Spine   Electrical Stimulation   Electrical Stimulation Location left lumbar   Electrical Stimulation Action IFC   Electrical Stimulation Parameters supine   Electrical Stimulation Goals Pain                  PT Short Term Goals - 10/23/15 3016    PT SHORT TERM GOAL #1   Title independent with initial HEP   Time 1   Period Weeks   Status New           PT Long Term Goals - 10/23/15 0109    PT LONG TERM GOAL #  1   Title understand proper posture and body mechanics   Time 8   Period Weeks   Status New   PT LONG TERM GOAL #2   Title decrease pain 50%   Time 8   Period Weeks   Status New   PT LONG TERM GOAL #3   Title increase lumbar ROM 50%   Time 8   Period Weeks   Status New   PT LONG TERM GOAL #4   Title be able to put on shoes and socks without help and without pain   Time 8   Period Weeks   Status New               Plan - 10/26/15 0908    Clinical Impression Statement pt tolerated ther ex with minimal increase in pain and tactile and VCing needed for posture.   PT Next Visit Plan slowly add exercises. Stretching        Problem List Patient Active Problem List   Diagnosis Date Noted  . NEUROPATHY-PERIPHERAL 11/06/2006  . ALLERGIC RHINITIS 11/06/2006  . NEPHROLITHIASIS 11/06/2006  . ABNORMAL BLOOD CHEMISTRY NEC 11/06/2006    Jakarius Flamenco,ANGIE PTA 10/26/2015, 9:10 AM  [facility] [address] [place], [place], [address] Phone: [phone]   Fax:  [phone]  Name: Nicole [name] MRN: [id number] Date of Birth: July 07, 1981

## 2015-10-30 ENCOUNTER — Encounter: Payer: Self-pay | Admitting: Physical Therapy

## 2015-10-30 ENCOUNTER — Ambulatory Visit: Payer: BLUE CROSS/BLUE SHIELD | Admitting: Physical Therapy

## 2015-10-30 DIAGNOSIS — M629 Disorder of muscle, unspecified: Secondary | ICD-10-CM

## 2015-10-30 DIAGNOSIS — M5442 Lumbago with sciatica, left side: Secondary | ICD-10-CM

## 2015-10-30 NOTE — Therapy (Signed)
[facility] [address] [place], [place], [address] Phone: [phone]   Fax:  [phone]  Physical Therapy Treatment  Patient Details  Name: Nicole [name] MRN: [id number] Date of Birth: [birth year] Referring Provider: Otelia Sergeant  Encounter Date: 10/30/2015      PT End of Session - 10/30/15 0924    Visit Number 3   Date for PT Re-Evaluation 12/23/15   PT Start Time 0854   PT Stop Time 0939   PT Time Calculation (min) 45 min   Activity Tolerance Patient limited by pain   Behavior During Therapy [facility] for tasks assessed/performed      Past Medical History  Diagnosis Date  . Asthma   . Hypertension   . Kidney stone   . GERD (gastroesophageal reflux disease)   . Depression 04/13/2009  . Kidney stones   . Pregnancy induced hypertension 03/10/2009  . Trichomonas 2005  . History of kidney stones       x 3  . H/O migraine   . Anemia   . H/O endometritis 03/20/09  . Blues 04/05/09  . Wears glasses   . Abdominal pain     Past Surgical History  Procedure Laterality Date  . Lithotripsy    . Kidney stone surgery  2005 & 2007  . Therapeutic abortion  2000  . Wisdom tooth extraction  2005 & 2007  . Surgery on left leg      2009  . Cholecystectomy  05/04/2012    Procedure: LAPAROSCOPIC CHOLECYSTECTOMY WITH INTRAOPERATIVE CHOLANGIOGRAM;  Surgeon: Romie Levee, MD;  Location: [facility];  Service: General;  Laterality: N/A;  Laparoscopic cholecysectomy with intraoperative cholangiogram    There were no vitals filed for this visit.  Visit Diagnosis:  Left-sided low back pain with left-sided sciatica  Hamstring tightness of both lower extremities      Subjective Assessment - 10/30/15 0856    Subjective "Im still getting their, its not like before"   Currently in Pain? Yes   Pain Score 5    Pain Location Back   Pain Orientation Left                         [facility] Adult PT  Treatment/Exercise - 10/30/15 0001    Exercises   Exercises Lumbar;Knee/Hip   Lumbar Exercises: Stretches   Passive Hamstring Stretch 3 reps;10 seconds   Piriformis Stretch 2 reps;10 seconds   Lumbar Exercises: Aerobic   Stationary Bike Nustep L 4 6 min   Lumbar Exercises: Machines for Strengthening   Cybex Lumbar Extension blue tband 2 sets 10   Other Lumbar Machine Exercise seated row 20# 2 sets 10, lat pull 20# 2 sets 10   Lumbar Exercises: Supine   Bridge 5 reps   Large Ball Oblique Isometric 20 reps   Other Supine Lumbar Exercises KTC with ball 15   Modalities   Modalities Moist Heat;Electrical Stimulation   Moist Heat Therapy   Number Minutes Moist Heat 15 Minutes   Moist Heat Location Lumbar Spine   Electrical Stimulation   Electrical Stimulation Location left lumbar   Electrical Stimulation Action IFC   Electrical Stimulation Parameters supine   Electrical Stimulation Goals Pain                  PT Short Term Goals - 10/23/15 3664    PT SHORT TERM GOAL #1   Title independent with initial HEP  Time 1   Period Weeks   Status New           PT Long Term Goals - 10/23/15 [id number]    PT LONG TERM GOAL #1   Title understand proper posture and body mechanics   Time 8   Period Weeks   Status New   PT LONG TERM GOAL #2   Title decrease pain 50%   Time 8   Period Weeks   Status New   PT LONG TERM GOAL #3   Title increase lumbar ROM 50%   Time 8   Period Weeks   Status New   PT LONG TERM GOAL #4   Title be able to put on shoes and socks without help and without pain   Time 8   Period Weeks   Status New               Plan - 10/30/15 [id number]    Clinical Impression Statement Pt ~ 15 minutes late for PT treatment. No reports of increase pain but pt has a facial gramine with back extensions and bridges. Pt reports that X-ray revealed a small fracture.    Pt will benefit from skilled therapeutic intervention in order to improve on the following  deficits Decreased range of motion;Decreased strength;Increased muscle spasms;Impaired flexibility;Improper body mechanics;Pain   Rehab Potential Good   PT Frequency 2x / week   PT Duration 8 weeks   PT Treatment/Interventions ADLs/Self Care Home Management;Cryotherapy;Electrical Stimulation;Moist Heat;Therapeutic exercise;Therapeutic activities;Manual techniques;Patient/family education;Ultrasound;Traction   PT Next Visit Plan slowly add exercises. Stretching        Problem List Patient Active Problem List   Diagnosis Date Noted  . NEUROPATHY-PERIPHERAL 11/06/2006  . ALLERGIC RHINITIS 11/06/2006  . NEPHROLITHIASIS 11/06/2006  . ABNORMAL BLOOD CHEMISTRY NEC 11/06/2006    Grayce Sessionsonald G Pemberton, PTA  10/30/2015, 9:28 AM  [facility] [address] [place], [place], [address] Phone: [phone]   Fax:  [phone]  Name: Cleone Slimchol G Syler MRN: [id number] Date of Birth: 12/23/1980

## 2015-11-02 ENCOUNTER — Ambulatory Visit: Payer: BLUE CROSS/BLUE SHIELD | Admitting: Physical Therapy

## 2015-11-02 ENCOUNTER — Telehealth: Payer: Self-pay

## 2015-11-02 NOTE — Telephone Encounter (Signed)
Left message for Nicole FieldsHugo Magri  [facility] on 10/24/15 & 10/26/15 asking for auth for PT. No return call

## 2015-11-13 ENCOUNTER — Other Ambulatory Visit: Payer: Self-pay | Admitting: Specialist

## 2015-11-13 DIAGNOSIS — M545 Low back pain: Secondary | ICD-10-CM

## 2015-11-21 ENCOUNTER — Ambulatory Visit
Admission: RE | Admit: 2015-11-21 | Discharge: 2015-11-21 | Disposition: A | Discharge status: Home / Self Care | Payer: Worker's Compensation | Source: Ambulatory Visit | Attending: Specialist | Admitting: Specialist

## 2015-11-21 DIAGNOSIS — M545 Low back pain: Secondary | ICD-10-CM

## 2016-05-14 ENCOUNTER — Ambulatory Visit: Payer: Worker's Compensation | Attending: Specialist | Admitting: Physical Therapy

## 2016-05-14 DIAGNOSIS — M62830 Muscle spasm of back: Secondary | ICD-10-CM

## 2016-05-14 DIAGNOSIS — R293 Abnormal posture: Secondary | ICD-10-CM

## 2016-05-14 DIAGNOSIS — M6281 Muscle weakness (generalized): Secondary | ICD-10-CM

## 2016-05-14 DIAGNOSIS — M5442 Lumbago with sciatica, left side: Secondary | ICD-10-CM | POA: Insufficient documentation

## 2016-05-14 DIAGNOSIS — M629 Disorder of muscle, unspecified: Secondary | ICD-10-CM | POA: Diagnosis present

## 2016-05-14 DIAGNOSIS — G8929 Other chronic pain: Secondary | ICD-10-CM | POA: Diagnosis present

## 2016-05-14 NOTE — Therapy (Addendum)
[place], [place], [address] Phone: [phone]   Fax:  [phone]  Physical Therapy Evaluation/Discharge Note  Patient Details  Name: Nicole [name] MRN: [id number] Date of Birth: 06-27-1981 Referring Provider: Basil Dess MD  Encounter Date: 05/14/2016      PT End of Session - 05/14/16 1112    Visit Number 1   Number of Visits 16   Date for PT Re-Evaluation 07/09/16   Authorization Type Generic Workman's Compensation  16 visits allowed   Authorization Time Period 07/09/16   PT Start Time 1105   PT Stop Time 1200   PT Time Calculation (min) 55 min   Activity Tolerance Patient limited by pain   Behavior During Therapy [facility] for tasks assessed/performed      Past Medical History:  Diagnosis Date  . Abdominal pain   . Anemia   . Asthma   . Blues 04/05/09  . Depression 04/13/2009  . GERD (gastroesophageal reflux disease)   . H/O endometritis 03/20/09  . H/O migraine   . History of kidney stones      x 3  . Hypertension   . Kidney stone   . Kidney stones   . Pregnancy induced hypertension 03/10/2009  . Trichomonas 2005  . Wears glasses     Past Surgical History:  Procedure Laterality Date  . CHOLECYSTECTOMY  05/04/2012   Procedure: LAPAROSCOPIC CHOLECYSTECTOMY WITH INTRAOPERATIVE CHOLANGIOGRAM;  Surgeon: Leighton Ruff, MD;  Location: [place];  Service: General;  Laterality: N/A;  Laparoscopic cholecysectomy with intraoperative cholangiogram  . KIDNEY STONE SURGERY  2005 & 2007  . LITHOTRIPSY    . surgery on left leg     2009  . THERAPEUTIC ABORTION  2000  . [place] EXTRACTION  2005 & 2007    There were no vitals filed for this visit.       Subjective Assessment - 05/14/16 1111    Subjective Pt enters clinic with left sided low back pain.  Pt reports that she was working at [employer] lst December and boxes fell on her left side while working. Pt reports that she has  pain but it got worse later.  She reports that she only had 3 days of physical therapy in March.  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 She does not do heavy lifting at her job.   Pertinent History HBP, peripheral neuropathy   Limitations Lifting;Standing;Walking;House hold activities   How long can you sit comfortably? 1 hour   How long can you stand comfortably? 5 - 10 min   How long can you walk comfortably? 5 -10 Min   Diagnostic tests MRI   Patient Stated Goals to be able to work and stand and drive a car with less pain   Currently in Pain? Yes   Pain Score 9    Pain Location Back   Pain Orientation Left   Pain Descriptors / Indicators Aching;Tingling   Pain Type Chronic pain   Pain Onset More than a month ago   Pain Frequency Constant   Aggravating Factors  pain reports up to 9/10 with bending and sitting too long, walking, sleeping on left side   Pain Relieving Factors rest , pain medication   Effect of Pain on Daily Activities can t do sweeping and bending            [facility] PT Assessment - 05/14/16 1114  Assessment   Medical Diagnosis left low back pain with sciatica and DDD   Referring Provider Basil Dess MD   Onset Date/Surgical Date 08/04/15  reported by pt   Hand Dominance Right   Prior Therapy PT in 10/2015     Precautions   Precautions None     Restrictions   Weight Bearing Restrictions No     Balance Screen   Has the patient fallen in the past 6 months No   Has the patient had a decrease in activity level because of a fear of falling?  No   Is the patient reluctant to leave their home because of a fear of falling?  No     Home Ecologist residence   [place] to enter   Entrance Stairs-Number of Steps [place] One level   Additional Comments Pt has difficulty doing house work     Prior Function    Level of Independence Independent   Vocation Full time employment   Vocation Requirements works at [employer] no heavy lifting   Leisure sometimes walks but not often     Cognition   Overall Cognitive Status Within Functional Limits for tasks assessed     Observation/Other Assessments   Observations pt with increased abdominal girth and unable to perform a bridge exericise   Focus on Therapeutic Outcomes (FOTO)  FOTO intake 19%, limitation 81% predicited 45%     Posture/Postural Control   Posture/Postural Control Postural limitations   Postural Limitations Rounded Shoulders;Forward head;Anterior pelvic tilt   Posture Comments Pt sacral sits during exam. left pelvic level elevated with increased left lumborum tightness     AROM   Overall AROM  Deficits   Lumbar Flexion 50%  uses Gower's sign to come back to standing and bends knees   Lumbar Extension 70%   Lumbar - Right Side Bend 25%  pain on left with side bend right   Lumbar - Left Side Bend 50%   Lumbar - Right Rotation 40%   pain on the left   Lumbar - Left Rotation 75%     Strength   Overall Strength Comments Pt with abdominal weakness 2/5 unable to hold table top   Right Hip Flexion 4-/5   Right Hip Extension 3/5   Right Hip ABduction 3/5   Left Hip Flexion 4-/5   Left Hip Extension 3-/5   Left Hip ABduction 3-/5   Right Knee Flexion 4+/5   Right Knee Extension 4+/5   Left Knee Flexion 4+/5   Left Knee Extension 4+/5   Right Ankle Dorsiflexion 4+/5   Left Ankle Dorsiflexion 4+/5     Flexibility   Hamstrings Pt with very tight hamstrings.  left 50, right 60      Palpation   Palpation comment She is very tender in the left lumbar area/ quadratus lumborum  and into the left buttock and posterior/lateral thigh.  She has spasms in the lumbar area     FABER test   findings Positive   Side LEft   Comment pt has difficult bil assuming position due to muscle tightness     Slump test   Findings Negative   Comment  bil     Ambulation/Gait   Ambulation/Gait Yes   Ambulation/Gait Assistance 7: Independent   Ambulation Distance (Feet) 100 Feet   Assistive device None   Gait Pattern Antalgic  left  Gait velocity 2.49f/sec                           PT Education - 05/14/16 1156    Education provided Yes   Education Details POC, , explanation of findings, posture sitting and standing, initial HEP, gave out posture and body mechanics to discuss next visit   Person(s) Educated Patient   Methods Explanation;Demonstration;Verbal cues;Handout   Comprehension Verbalized understanding;Returned demonstration          PT Short Term Goals - 05/14/16 1220      PT SHORT TERM GOAL #1   Title independent with initial HEP   Time 4   Period Weeks   Status New     PT SHORT TERM GOAL #2   Title "Report pain decrease from  9 /10 to  5 /10.   Time 4   Period Weeks   Status New     PT SHORT TERM GOAL #3   Title "Demonstrate understanding of proper sitting posture and be more conscious of position and posture throughout the day.    Time 4   Period Weeks   Status New     PT SHORT TERM GOAL #4   Title Pt will be able to drive with 561%greater ease regarding back pain   Time 4   Period Weeks   Status New           PT Long Term Goals - 05/14/16 1221      PT LONG TERM GOAL #1   Title "Demonstrate and verbalize techniques to reduce the risk of re-injury including: lifting, posture, body mechanics.    Time 8   Period Weeks   Status New     PT LONG TERM GOAL #2   Title "Pt will be independent with advanced HEP.    Time 8   Period Weeks   Status New     PT LONG TERM GOAL #3   Title "Pt will tolerate sitting for 1 hour without increased pain to ride in car without increased pain.   Time 8   Period Weeks   Status New     PT LONG TERM GOAL #4   Title Pt will be able to perform household chores such as sweeping and laundry with 50% to 75% greater ease than on evaluation    Time 8   Period Weeks   Status New     PT LONG TERM GOAL #5   Title "FOTO will improve from  81%limitation  to 45% limitation    indicating improved functional mobility .    Time 8   Period Weeks   Status New     Additional Long Term Goals   Additional Long Term Goals Yes     PT LONG TERM GOAL #6   Title Pt will be able to perform functional activities at work including stair climbing  with pain no greater than 3/10   Time 8   Period Weeks   Status New               Plan - 05/14/16 1158    Clinical Impression Statement Pt is a 35year old female with c/o of increased LBP since boxes fell on her at [employer] 12[place]23[place]17. Pt has been granted 16 visits for workman's comp to maximize funtion.   Pt presents with  moderate complexity eval with signs and symptoms compatible with lumbar DDD and has moderate bil hamstring  tightness/ left quadratus lumborum spasm and marked muscle weakness in core/hip  3 to 3-/5.Marland Kitchen Pt would benefit from skilled PT for 2 times a week for 8 weeks to address above impariments and functional limitations and return to manageable pain  PLOF. in order to continue working and return to exericises   Rehab Potential Good   PT Frequency 2x / week   PT Duration 8 weeks   PT Treatment/Interventions ADLs/Self Care Home Management;Cryotherapy;Electrical Stimulation;Moist Heat;Therapeutic exercise;Therapeutic activities;Manual techniques;Patient/family education;Ultrasound;Traction;Dry needling;Taping;Passive range of motion;Neuromuscular re-education;Stair training   PT Next Visit Plan go over body mechanics and ADL's handout given at first visit to pre read.  Quadratus lumborum stretch.  Pre pilates   PT Home Exercise Plan intial posture and initial HEP   Consulted and Agree with Plan of Care Patient      Patient will benefit from skilled therapeutic intervention in order to improve the following deficits and impairments:  Decreased range of motion, Decreased strength,  Increased muscle spasms, Impaired flexibility, Improper body mechanics, Pain, Difficulty walking, Postural dysfunction, Obesity, Hypomobility  Visit Diagnosis: Chronic left-sided low back pain with left-sided sciatica  Hamstring tightness of both lower extremities  Abnormal posture  Muscle spasm of back  Muscle weakness (generalized)      G-Codes - 2016-05-18 1207/09/28    Functional Assessment Tool Used FOTO   Functional Limitation Changing and maintaining body position   Changing and Maintaining Body Position Current Status (J5009) At least 80 percent but less than 100 percent impaired, limited or restricted  81%   Changing and Maintaining Body Position Goal Status (F8182) --  45%       Problem List Patient Active Problem List   Diagnosis Date Noted  . NEUROPATHY-PERIPHERAL 11/06/2006  . ALLERGIC RHINITIS 11/06/2006  . NEPHROLITHIASIS 11/06/2006  . ABNORMAL BLOOD CHEMISTRY NEC 11/06/2006    Voncille Lo, PT Exercise Expert for the Aging Adult  05-18-2016 12:30 PM Phone: [phone] Fax: [facility] [address] [place], [place], [address] Phone: [phone]   Fax:  [phone]  Name: Nicole [name] MRN: [id number] Date of Birth: 16-Mar-1981   PHYSICAL THERAPY DISCHARGE SUMMARY  Visits from Start of Care: 1  Current functional level related to goals / functional outcomes: unknown   Remaining deficits: As above   Education / Equipment: Initial HEP and posture  Plan:                                                    Patient goals were not met. Patient is being discharged due to not returning since the last visit.  ?????   Pt was sent to another clinic for PT by [facility]' comp    Voncille Lo, PT Exercise Expert for the Aging Adult  07/16/16 1:13 PM Phone: [phone] Fax: [phone]

## 2016-05-14 NOTE — Patient Instructions (Signed)
Leg Extension (Hamstring)   Sit toward front edge of chair, with leg out straight, heel on floor, toes pointing toward body. Keeping back straight, bend forward at hip, breathing out through pursed lips. Return, breathing in. Repeat _2-3__ times. Hold for 30 to 60 seconds Repeat with other leg. Do _3__ sessions per day. Variation: Perform from standing position, with support.  Hamstring Step 1   Straighten left knee. Keep knee level with other knee or on bolster. Hold _30__ seconds x 2 or 3. Relax knee by returning foot to start.  Use strap or belt Repeat _2-3__ times.        Kneel, buttocks on heels. Fold upper body forward from hips. Then reach to each side as far as possible, keeping chest low toward floor. Hold each position _30__ seconds. Repeat __3_ times per session. Do __2_ sessions per day.  Copyright  VHI. All rights reserved.        Copyright  VHI. All rights reserved.  Posture - Standing   Good posture is important. Avoid slouching and forward head thrust. Maintain curve in low back and align ears over shoul- ders, hips over ankles.  Pull your belly button in toward your back bone.  Stand with with even weight in your heel and toes,  Pretend you have a string coming from your chest pulling you up to the sky.  Chin down.  This will help you have better posture   Copyright  VHI. All rights reserved.  Posture - Sitting   Sit upright, head facing forward. Try using a roll to support lower back. Keep shoulders relaxed, and avoid rounded back. Keep hips level with knees. Avoid crossing legs for long periods. Sit on you sit bones not your tail bone.   Copyright  VHI. All rights reserved.  Sleeping on Back  Place pillow under knees. A pillow with cervical support and a roll around waist are also helpful. Copyright  VHI. All rights reserved.  Sleeping on Side Place pillow between knees. Use cervical support under neck and a roll around waist as  needed. Copyright  VHI. All rights reserved.   Sleeping on Stomach   If this is the only desirable sleeping position, place pillow under lower legs, and under stomach or chest as needed.  Posture - Sitting   Sit upright, head facing forward. Try using a roll to support lower back. Keep shoulders relaxed, and avoid rounded back. Keep hips level with knees. Avoid crossing legs for long periods. Stand to Sit / Sit to Stand   To sit: Bend knees to lower self onto front edge of chair, then scoot back on seat. To stand: Reverse sequence by placing one foot forward, and scoot to front of seat. Use rocking motion to stand up.   Work Height and Reach  Ideal work height is no more than 2 to 4 inches below elbow level when standing, and at elbow level when sitting. Reaching should be limited to arm's length, with elbows slightly bent.  Bending  Bend at hips and knees, not back. Keep feet shoulder-width apart.    Posture - Standing   Good posture is important. Avoid slouching and forward head thrust. Maintain curve in low back and align ears over shoul- ders, hips over ankles.  Alternating Positions   Alternate tasks and change positions frequently to reduce fatigue and muscle tension. Take rest breaks. Computer Work   Position work to Art gallery manager. Use proper work and seat height. Keep shoulders back and down,  wrists straight, and elbows at right angles. Use chair that provides full back support. Add footrest and lumbar roll as needed.  Getting Into / Out of Car  Lower self onto seat, scoot back, then bring in one leg at a time. Reverse sequence to get out.  Dressing  Lie on back to pull socks or slacks over feet, or sit and bend leg while keeping back straight.    Housework - Sink  Place one foot on ledge of cabinet under sink when standing at sink for prolonged periods.   Pushing / Pulling  Pushing is preferable to pulling. Keep back in proper alignment, and use leg  muscles to do the work.  Deep Squat   Squat and lift with both arms held against upper trunk. Tighten stomach muscles without holding breath. Use smooth movements to avoid jerking.  Avoid Twisting   Avoid twisting or bending back. Pivot around using foot movements, and bend at knees if needed when reaching for articles.  Carrying Luggage   Distribute weight evenly on both sides. Use a cart whenever possible. Do not twist trunk. Move body as a unit.   Lifting Principles .Maintain proper posture and head alignment. .Slide object as close as possible before lifting. .Move obstacles out of the way. .Test before lifting; ask for help if too heavy. .Tighten stomach muscles without holding breath. .Use smooth movements; do not jerk. .Use legs to do the work, and pivot with feet. .Distribute the work load symmetrically and close to the center of trunk. .Push instead of pull whenever possible.   Ask For Help   Ask for help and delegate to others when possible. Coordinate your movements when lifting together, and maintain the low back curve.  Log Roll   Lying on back, bend left knee and place left arm across chest. Roll all in one movement to the right. Reverse to roll to the left. Always move as one unit. Housework - Sweeping  Use long-handled equipment to avoid stooping.   Housework - Wiping  Position yourself as close as possible to reach work surface. Avoid straining your back.  Laundry - Unloading Wash   To unload small items at bottom of washer, lift leg opposite to arm being used to reach.  Gardening - Raking  Move close to area to be raked. Use arm movements to do the work. Keep back straight and avoid twisting.     Cart  When reaching into cart with one arm, lift opposite leg to keep back straight.   Getting Into / Out of Bed  Lower self to lie down on one side by raising legs and lowering head at the same time. Use arms to assist moving without twisting.  Bend both knees to roll onto back if desired. To sit up, start from lying on side, and use same move-ments in reverse. Housework - Vacuuming  Hold the vacuum with arm held at side. Step back and forth to move it, keeping head up. Avoid twisting.   Laundry - Armed forces training and education officerLoading Wash  Position laundry basket so that bending and twisting can be avoided.   Laundry - Unloading Dryer  Squat down to reach into clothes dryer or use a reacher.  Gardening - Weeding / Psychiatric nurselanting  Squat or Kneel. Knee pads may be helpful.                    Nicole LahLawrie [name], PT Exercise Expert for the Aging Adult  05/14/16 11:55 AM Phone: [phone] Fax: [phone]

## 2016-05-16 ENCOUNTER — Ambulatory Visit: Payer: Self-pay | Admitting: Physical Therapy

## 2016-05-22 ENCOUNTER — Ambulatory Visit: Payer: Self-pay | Attending: Internal Medicine | Admitting: Physical Therapy

## 2016-05-22 ENCOUNTER — Telehealth: Payer: Self-pay | Admitting: Physical Therapy

## 2016-05-22 NOTE — Telephone Encounter (Signed)
Spoke with patient regarding missed appointment. She reports WC is sending her to a different location for PT and she will not be returning here for appointments. Will cancel future appointments.

## 2016-05-23 ENCOUNTER — Ambulatory Visit: Payer: Self-pay | Admitting: Physical Therapy

## 2016-05-27 ENCOUNTER — Ambulatory Visit: Payer: Self-pay | Admitting: Physical Therapy

## 2016-05-30 ENCOUNTER — Encounter: Payer: Self-pay | Admitting: Physical Therapy

## 2016-06-03 ENCOUNTER — Encounter: Payer: Self-pay | Admitting: Physical Therapy

## 2016-06-05 ENCOUNTER — Encounter: Payer: Self-pay | Admitting: Physical Therapy

## 2016-06-10 ENCOUNTER — Encounter: Payer: Self-pay | Admitting: Physical Therapy

## 2016-06-13 ENCOUNTER — Ambulatory Visit (INDEPENDENT_AMBULATORY_CARE_PROVIDER_SITE_OTHER): Payer: Self-pay | Admitting: Specialist

## 2016-06-14 ENCOUNTER — Encounter: Payer: Self-pay | Admitting: Physical Therapy

## 2016-06-18 ENCOUNTER — Ambulatory Visit (INDEPENDENT_AMBULATORY_CARE_PROVIDER_SITE_OTHER): Payer: Worker's Compensation | Admitting: Specialist

## 2016-06-18 ENCOUNTER — Telehealth (INDEPENDENT_AMBULATORY_CARE_PROVIDER_SITE_OTHER): Payer: Self-pay | Admitting: *Deleted

## 2016-06-18 ENCOUNTER — Encounter (INDEPENDENT_AMBULATORY_CARE_PROVIDER_SITE_OTHER): Payer: Self-pay | Admitting: Specialist

## 2016-06-18 VITALS — BP 130/90 | HR 69 | Ht 65.0 in | Wt 225.0 lb

## 2016-06-18 DIAGNOSIS — M5136 Other intervertebral disc degeneration, lumbar region: Secondary | ICD-10-CM | POA: Diagnosis not present

## 2016-06-18 DIAGNOSIS — M5126 Other intervertebral disc displacement, lumbar region: Secondary | ICD-10-CM

## 2016-06-18 MED ORDER — GABAPENTIN 100 MG PO CAPS: 100.0000 mg | ORAL_CAPSULE | Freq: Two times a day (BID) | ORAL | 3 refills | Status: DC

## 2016-06-18 MED ORDER — TRAMADOL HCL 50 MG PO TABS
50.0000 mg | ORAL_TABLET | Freq: Four times a day (QID) | ORAL | 0 refills | Status: DC | PRN
Start: 2016-06-18 — End: 2018-12-14

## 2016-06-18 MED ORDER — MELOXICAM 15 MG PO TABS: 15.0000 mg | ORAL_TABLET | Freq: Every day | ORAL | 3 refills | Status: DC

## 2016-06-18 NOTE — Progress Notes (Addendum)
Office Visit Note   Patient: Nicole [name]           Date of Birth: 11-13-1980           MRN: [id number] Visit Date: 06/18/2016              Requested by: Fleet ContrasEdwin Avbuere, MD [address] [place], [place] [id number] PCP: Dorrene GermanEdwin A Avbuere, MD   Assessment & Plan: Visit Diagnoses:  1. Herniated lumbar intervertebral disc   2. Degenerative disc disease, lumbar   This patient sustained an injury on the job 08/04/2015. She was treated with conservative management of persistent ongoing back pain radiation into her legs and MRI showed a central disc protrusion at this L5-S1 level mild subarticular lateral recessed stenosis bilaterally. She has been treated conservatively with epidural steroid injections and physical therapy has continued to work. Presently attending physical therapy at the integrative physical therapy. She reports that therapy is going to continue for an additional 2 or 3 times and then apparently is slated to be discontinued. At our last visit she was given a prescription to be seen in physical therapy at [facility] as apparently was changed to integrated physical therapy by her Workmen's Compensation carrier. Therapy order was provided to integrated therapy and she is continued then with therapy since last visit. We called and obtained from the new physical therapy facility and have reviewed these. Overall her clinical exam shows no focal motor deficit and neurotension signs are negative. She has some ongoing complaints of back pain and discomfort in her legs with negative neurotension signs and no sign of focal neurologic deficit. Based on her examination she is not a good candidate for surgical intervention. I recommend that she continue with conservative treatment with physical therapy use of tramadol and meloxicam and are normal or symptomatic pain control. She continues to work 40 hours a week in addition to attending physical therapy so that she is likely to notice some  improvement in her ability to perform work without going to therapy due to increased endurance and decrease stresses associated with physical therapy. Overall I expect to improve her endurance with the therapy program. Our plan is to see her back then in 4 weeks to assess her progress and determine whether or not she is at maximum medical improvement nearly 11 and half months following her initial injury.  Plan: Avoid frequent bending and stooping  No lifting greater than 15 lbs. May use ice or moist heat for pain. Weight loss is of benefit. .    Follow-Up Instructions: Return in about 4 weeks (around 07/16/2016).   Orders:  No orders of the defined types were placed in this encounter.  No orders of the defined types were placed in this encounter.     Procedures: No procedures performed   Clinical Data: No additional findings.   Subjective: Chief Complaint  Patient presents with  . Lower Back - Pain, Follow-up    35 year old female injured her back in 08/04/2015 while lifting boxes at work. Follow for some time with back pain and left leg greater than right sciatica. MRI with a central HNP L5-S1 with mild lateral recess narrowing. She is currently in physical therapy, undergoing a program directed at increasing core strength and her lower extremity strength and endurance. She is still  experiencing back and right leg pain, taking tramadol. Still experiencing left posterior thigh pain with working, she is working 40 hours per week in addition to going to  intergrative PT for  Physical therapy treatments. Notes from PT indicate that she will likely require up to 12 weeks total of PT to obtain max improvement. No bowel or bladder incontinence, does have constipation and occasional HAs. Sees Dr. Concepcion ElkAvbuere for primary care concerns.   recheck LBP, advised to go to PT, has been going since last month for hamstring, PT is working some, [facility], still has a lot of  pain in back of calf to her back of knee, makes it hard to sleep.   Review of Systems  Constitutional: Negative.   HENT: Positive for rhinorrhea, sinus pressure and sneezing.   Eyes: Positive for redness and itching. Negative for photophobia, pain, discharge and visual disturbance.  Respiratory: Negative for cough, shortness of breath and wheezing.   Cardiovascular: Negative.   Gastrointestinal: Positive for abdominal pain.  Endocrine: Negative for cold intolerance and heat intolerance.  Genitourinary: Negative.   Musculoskeletal: Positive for back pain, gait problem and myalgias.  Skin: Negative.   Allergic/Immunologic: Negative for environmental allergies, food allergies and immunocompromised state.  Neurological: Negative for dizziness, tremors, seizures, syncope, facial asymmetry, speech difficulty, light-headedness, numbness and headaches.  Hematological: Negative.   Psychiatric/Behavioral: Negative.      Objective: Vital Signs: BP 130/90 (BP Location: Left Arm, Patient Position: Sitting, Cuff Size: Normal)   Pulse 69   Ht 5\' 5"  (1.651 m)   Wt 225 lb (102.1 kg)   BMI 37.44 kg/m   Physical Exam  Constitutional: She is oriented to person, place, and time. She appears well-developed and well-nourished.  Eyes: EOM are normal. Pupils are equal, round, and reactive to light.  Neck: Normal range of motion. Neck supple.  Pulmonary/Chest: Effort normal and breath sounds normal.  Abdominal: Soft. Bowel sounds are normal.  Neurological: She is alert and oriented to person, place, and time.  Skin: Skin is warm and dry.  Psychiatric: She has a normal mood and affect. Her behavior is normal. Judgment and thought content normal.    Back Exam   Tenderness  The patient is experiencing tenderness in the lumbar.  Range of Motion  Extension: abnormal  Flexion: abnormal  Lateral Bend Right: abnormal  Lateral Bend Left: abnormal  Rotation Right: abnormal  Rotation Left: abnormal    Muscle Strength  Right Quadriceps:  5/5  Left Quadriceps:  5/5  Right Hamstrings:  5/5  Left Hamstrings:  5/5   Tests  Straight leg raise right: negative Straight leg raise left: negative  Reflexes  Patellar: 1/4 Achilles: 1/4 Babinski's sign: normal   Other  Toe Walk: normal Heel Walk: normal Sensation: normal Erythema: no back redness Scars: absent      Specialty Comments:  No specialty comments available.  Imaging: No results found.   PMFS History: Patient Active Problem List   Diagnosis Date Noted  . NEUROPATHY-PERIPHERAL 11/06/2006  . ALLERGIC RHINITIS 11/06/2006  . NEPHROLITHIASIS 11/06/2006  . ABNORMAL BLOOD CHEMISTRY NEC 11/06/2006   Past Medical History:  Diagnosis Date  . Abdominal pain   . Anemia   . Asthma   . Blues 04/05/09  . Depression 04/13/2009  . GERD (gastroesophageal reflux disease)   . H/O endometritis 03/20/09  . H/O migraine   . History of kidney stones      x 3  . Hypertension   . Kidney stone   . Kidney stones   . Pregnancy induced hypertension 03/10/2009  . Trichomonas 2005  . Wears glasses     Family History  Problem  Relation Age of Onset  . Hypertension Mother   . Cancer Sister     Leukemia  . Alcohol abuse Brother     Past Surgical History:  Procedure Laterality Date  . CHOLECYSTECTOMY  05/04/2012   Procedure: LAPAROSCOPIC CHOLECYSTECTOMY WITH INTRAOPERATIVE CHOLANGIOGRAM;  Surgeon: Romie Levee, MD;  Location: [facility];  Service: General;  Laterality: N/A;  Laparoscopic cholecysectomy with intraoperative cholangiogram  . KIDNEY STONE SURGERY  2005 & 2007  . LITHOTRIPSY    . surgery on left leg     2009  . THERAPEUTIC ABORTION  2000  . WISDOM TOOTH EXTRACTION  2005 & 2007   Social History   Occupational History  . Not on file.   Social History Main Topics  . Smoking status: Never Smoker  . Smokeless tobacco: Never Used  . Alcohol use No  . Drug use: No  . Sexual activity: Yes    Birth  control/ protection: IUD     Comment: mirena

## 2016-06-18 NOTE — Patient Instructions (Signed)
Avoid frequent bending and stooping  No lifting greater than 15 lbs. May use ice or moist heat for pain. Weight loss is of benefit.

## 2016-06-18 NOTE — Telephone Encounter (Signed)
Pt. Called stating she has an appt 12/13 but will have to pay out of pocket for this appt unless it is before 11/27. Pt wanted to move appt up. I advised pt that there were no available appts. before the one she has scheduled.

## 2016-06-19 NOTE — Telephone Encounter (Signed)
There is one open spot on 07/11/2016, besides that I have no where to work her in. She can keep calling and checking to see if we have any openings/cancelations.  Thanks. Herbert SetaHeather

## 2016-07-03 ENCOUNTER — Encounter (INDEPENDENT_AMBULATORY_CARE_PROVIDER_SITE_OTHER): Payer: Self-pay | Admitting: Specialist

## 2016-07-03 ENCOUNTER — Ambulatory Visit (INDEPENDENT_AMBULATORY_CARE_PROVIDER_SITE_OTHER): Payer: Worker's Compensation | Admitting: Specialist

## 2016-07-03 VITALS — BP 144/97 | HR 71 | Ht 63.0 in | Wt 225.0 lb

## 2016-07-03 DIAGNOSIS — M5136 Other intervertebral disc degeneration, lumbar region: Secondary | ICD-10-CM

## 2016-07-03 DIAGNOSIS — M5126 Other intervertebral disc displacement, lumbar region: Secondary | ICD-10-CM

## 2016-07-03 NOTE — Progress Notes (Signed)
Patient returns for a 4 week follow up for low back. States still doing Physical Therapy however only has one more visit due to workers comp. Feels that therapy does help. Still low back pain, radiating in left leg. Left leg numbness and pain.

## 2016-07-03 NOTE — Progress Notes (Signed)
Office Visit Note   Patient: Nicole [name]           Date of Birth: 14-Feb-1981           MRN: [id number] Visit Date: 07/03/2016              Requested by: Fleet ContrasEdwin Avbuere, MD [address] [place], [place] [id number] PCP: Dorrene GermanEdwin A Avbuere, MD   Assessment & Plan: Visit Diagnoses:  1. Lumbar disc herniation   2. Lumbar degenerative disc disease     Plan: Avoid frequent bending and stooping  No lifting greater than 10-15 lbs. May use ice or moist heat for pain. Weight loss is of benefit. I think you have reached the maximum that treatment can do for you. I am discharging you at this point with maximum medical improvement, impairment is 8 % for your spine for a central disc herniation at the L5-S1 level with persistent ongoing pain and intermittant sciatica.       Follow-Up Instructions: Return if symptoms worsen or fail to improve.   Orders:  No orders of the defined types were placed in this encounter.  No orders of the defined types were placed in this encounter.     Procedures: No procedures performed   Clinical Data: Findings:  MRI 2017 small central disc protusion L5-S1 with mild lateral recess stenosis bilateral L5-S1.     Subjective: Chief Complaint  Patient presents with  . Lower Back - Pain, Follow-up    Patient returns for a 4 week follow up for low back. States still doing Physical Therapy however only has one more visit due to workers comp. Feels that therapy does help. Still low back pain, radiating in left leg. Left leg numbness and pain. Has been to PT and workman's compensation is not assisting with payment. She is scheduled to sit down this Monday for a discussion with her legal counsel and [employer] ex representatives. She has undergone ESI in August without much benefit. Still complains of primary low back and left buttock pain. Worse with standing or sitting or walking. Riding in a car is painful. Presently is working [employer] in [place] but lives  in [place]. No bowel or bladder problems.    Review of Systems  Constitutional: Negative.   HENT: Negative.   Eyes: Negative.   Respiratory: Negative.   Cardiovascular: Negative.   Gastrointestinal: Negative.   Endocrine: Negative.   Genitourinary: Negative.   Musculoskeletal: Negative.   Skin: Negative.   Allergic/Immunologic: Negative.   Neurological: Negative.   Hematological: Negative.   Psychiatric/Behavioral: Negative.      Objective: Vital Signs: BP (!) 144/97   Pulse 71   Ht 5\' 3"  (1.6 m)   Wt 225 lb (102.1 kg)   BMI 39.86 kg/m   Physical Exam  Constitutional: She is oriented to person, place, and time. She appears well-developed and well-nourished.  Eyes: EOM are normal. Pupils are equal, round, and reactive to light.  Neck: Normal range of motion. Neck supple.  Pulmonary/Chest: Effort normal and breath sounds normal.  Abdominal: Soft. Bowel sounds are normal.  Neurological: She is alert and oriented to person, place, and time.  Skin: Skin is warm and dry.  Psychiatric: She has a normal mood and affect. Her behavior is normal. Judgment and thought content normal.    Back Exam   Tenderness  The patient is experiencing tenderness in the lumbar.  Range of Motion  Extension: abnormal  Flexion:  60 abnormal  Lateral Bend Right:  abnormal  Lateral Bend Left: abnormal  Rotation Right: abnormal  Rotation Left: abnormal   Muscle Strength  Right Quadriceps:  5/5  Left Quadriceps:  5/5  Right Hamstrings:  5/5  Left Hamstrings:  5/5   Tests  Straight leg raise right: negative Straight leg raise left: negative  Reflexes  Patellar: 2/4 Achilles: 2/4 Babinski's sign: normal   Other  Toe Walk: normal Heel Walk: normal Sensation: decreased Gait: normal  Erythema: no back redness Scars: absent      Specialty Comments:  No specialty comments available.  Imaging: No results found.   PMFS History: Patient Active Problem List   Diagnosis  Date Noted  . NEUROPATHY-PERIPHERAL 11/06/2006  . ALLERGIC RHINITIS 11/06/2006  . NEPHROLITHIASIS 11/06/2006  . ABNORMAL BLOOD CHEMISTRY NEC 11/06/2006   Past Medical History:  Diagnosis Date  . Abdominal pain   . Anemia   . Asthma   . Blues 04/05/09  . Depression 04/13/2009  . GERD (gastroesophageal reflux disease)   . H/O endometritis 03/20/09  . H/O migraine   . History of kidney stones      x 3  . Hypertension   . Kidney stone   . Kidney stones   . Pregnancy induced hypertension 03/10/2009  . Trichomonas 2005  . Wears glasses     Family History  Problem Relation Age of Onset  . Hypertension Mother   . Cancer Sister     Leukemia  . Alcohol abuse Brother     Past Surgical History:  Procedure Laterality Date  . CHOLECYSTECTOMY  05/04/2012   Procedure: LAPAROSCOPIC CHOLECYSTECTOMY WITH INTRAOPERATIVE CHOLANGIOGRAM;  Surgeon: Romie LeveeAlicia Thomas, MD;  Location: [facility];  Service: General;  Laterality: N/A;  Laparoscopic cholecysectomy with intraoperative cholangiogram  . KIDNEY STONE SURGERY  2005 & 2007  . LITHOTRIPSY    . surgery on left leg     2009  . THERAPEUTIC ABORTION  2000  . WISDOM TOOTH EXTRACTION  2005 & 2007   Social History   Occupational History  . Not on file.   Social History Main Topics  . Smoking status: Never Smoker  . Smokeless tobacco: Never Used  . Alcohol use No  . Drug use: No  . Sexual activity: Yes    Birth control/ protection: IUD     Comment: mirena

## 2016-07-03 NOTE — Patient Instructions (Signed)
Avoid frequent bending and stooping  No lifting greater than 10-15 lbs. May use ice or moist heat for pain. Weight loss is of benefit. I think you have reached the maximum that treatment can do for you. I am discharging you at this point with maximum medical improvement, impairment is 8 % for your spine for a central disc herniation at the L5-S1 level with persistent ongoing pain and intermittant sciatica.

## 2016-07-24 ENCOUNTER — Ambulatory Visit (INDEPENDENT_AMBULATORY_CARE_PROVIDER_SITE_OTHER): Payer: Self-pay | Admitting: Specialist

## 2016-07-25 ENCOUNTER — Encounter (HOSPITAL_COMMUNITY): Payer: Self-pay | Admitting: Nurse Practitioner

## 2016-07-25 ENCOUNTER — Emergency Department (HOSPITAL_COMMUNITY)
Admission: EM | Admit: 2016-07-25 | Discharge: 2016-07-25 | Disposition: A | Discharge status: Home / Self Care | Payer: Self-pay | Attending: Emergency Medicine | Admitting: Emergency Medicine

## 2016-07-25 ENCOUNTER — Emergency Department (HOSPITAL_COMMUNITY): Payer: Self-pay

## 2016-07-25 DIAGNOSIS — R69 Illness, unspecified: Secondary | ICD-10-CM

## 2016-07-25 DIAGNOSIS — I10 Essential (primary) hypertension: Secondary | ICD-10-CM | POA: Insufficient documentation

## 2016-07-25 DIAGNOSIS — J45909 Unspecified asthma, uncomplicated: Secondary | ICD-10-CM | POA: Insufficient documentation

## 2016-07-25 DIAGNOSIS — J111 Influenza due to unidentified influenza virus with other respiratory manifestations: Secondary | ICD-10-CM

## 2016-07-25 DIAGNOSIS — R05 Cough: Secondary | ICD-10-CM | POA: Insufficient documentation

## 2016-07-25 DIAGNOSIS — R509 Fever, unspecified: Secondary | ICD-10-CM | POA: Insufficient documentation

## 2016-07-25 MED ORDER — ACETAMINOPHEN 325 MG PO TABS
650.0000 mg | ORAL_TABLET | Freq: Once | ORAL | Status: AC
  Administered 2016-07-25: 650 mg via ORAL
  Filled 2016-07-25: qty 2

## 2016-07-25 MED ORDER — IBUPROFEN 200 MG PO TABS
600.0000 mg | ORAL_TABLET | Freq: Once | ORAL | Status: AC
  Administered 2016-07-25: 600 mg via ORAL
  Filled 2016-07-25: qty 3

## 2016-07-25 MED ORDER — BENZONATATE 100 MG PO CAPS
100.0000 mg | ORAL_CAPSULE | Freq: Three times a day (TID) | ORAL | 0 refills | Status: DC
Start: 2016-07-25 — End: 2018-12-14

## 2016-07-25 NOTE — ED Provider Notes (Signed)
[facility]-EMERGENCY DEPT Provider Note   CSN: [id number] Arrival date & time: 07/25/16  2008     History   Chief Complaint Chief Complaint  Patient presents with  . Sore Throat  . Cough  . URI    HPI Nicole [name] is a 35 y.o. female.  Nicole [name] is a 35 y.o. female with h/o asthma, GERD, HTN, depression presents to ED with complaint of fever and cold like sxs onset 2 days ago. Pt reports Tmax of 102/103. She has been taking tylenol. She also endorses chills, generalized fatigue, myalgias, nasal congestion, rhinorrhea, sore throat, watery eye discharge, ear pain/pressure, and productive cough with yellow sputum. She also reports intermittent wheezing. She denies SOB, CP, abdominal pain, or N/V. She has tried tylenol, cough drops, and theraful with minimal relief. Reports multiple sick contacts in home with similar sxs. She did not receive a flu shot this year.      Past Medical History:  Diagnosis Date  . Abdominal pain   . Anemia   . Asthma   . Blues 04/05/09  . Depression 04/13/2009  . GERD (gastroesophageal reflux disease)   . H/O endometritis 03/20/09  . H/O migraine   . History of kidney stones      x 3  . Hypertension   . Kidney stone   . Kidney stones   . Pregnancy induced hypertension 03/10/2009  . Trichomonas 2005  . Wears glasses     Patient Active Problem List   Diagnosis Date Noted  . NEUROPATHY-PERIPHERAL 11/06/2006  . ALLERGIC RHINITIS 11/06/2006  . NEPHROLITHIASIS 11/06/2006  . ABNORMAL BLOOD CHEMISTRY NEC 11/06/2006    Past Surgical History:  Procedure Laterality Date  . CHOLECYSTECTOMY  05/04/2012   Procedure: LAPAROSCOPIC CHOLECYSTECTOMY WITH INTRAOPERATIVE CHOLANGIOGRAM;  Surgeon: Romie LeveeAlicia Thomas, MD;  Location: [facility];  Service: General;  Laterality: N/A;  Laparoscopic cholecysectomy with intraoperative cholangiogram  . KIDNEY STONE SURGERY  2005 & 2007  . LITHOTRIPSY    . surgery on left leg     2009  . THERAPEUTIC ABORTION   2000  . WISDOM TOOTH EXTRACTION  2005 & 2007    OB History    Gravida Para Term Preterm AB Living   3 2 2   1 2    SAB TAB Ectopic Multiple Live Births     1     2       Home Medications    Prior to Admission medications   Medication Sig Start Date End Date Taking? Authorizing Provider  benzonatate (TESSALON) 100 MG capsule Take 1 capsule (100 mg total) by mouth every 8 (eight) hours. 07/25/16   Lona KettleAshley Laurel Ceasar Decandia, PA-C  ciprofloxacin (CIPRO) 500 MG tablet Take 1 tablet (500 mg total) by mouth 2 (two) times daily. 02/08/15   Armando ReichertHeather D Hogan, CNM  gabapentin (NEURONTIN) 100 MG capsule Take 1 capsule (100 mg total) by mouth 2 (two) times daily. 06/18/16   Kerrin ChampagneJames E Nitka, MD  losartan (COZAAR) 50 MG tablet Take 50 mg by mouth daily.    Historical Provider, MD  meloxicam (MOBIC) 15 MG tablet Take 1 tablet (15 mg total) by mouth daily. 06/18/16   Kerrin ChampagneJames E Nitka, MD  methocarbamol (ROBAXIN) 500 MG tablet Take 1 tablet (500 mg total) by mouth 2 (two) times daily. 05/20/15   April Palumbo, MD  omeprazole (PRILOSEC OTC) 20 MG tablet Take 20 mg by mouth daily.    Historical Provider, MD  phenazopyridine (PYRIDIUM) 200 MG tablet Take 200  mg by mouth 3 (three) times daily as needed for pain.    Historical Provider, MD  traMADol (ULTRAM) 50 MG tablet Take 1 tablet (50 mg total) by mouth every 6 (six) hours as needed. 06/18/16   Kerrin ChampagneJames E Nitka, MD    Family History Family History  Problem Relation Age of Onset  . Hypertension Mother   . Cancer Sister     Leukemia  . Alcohol abuse Brother     Social History Social History  Substance Use Topics  . Smoking status: Never Smoker  . Smokeless tobacco: Never Used  . Alcohol use No     Allergies   Oxycodone   Review of Systems Review of Systems  Constitutional: Positive for chills, fatigue and fever.  HENT: Positive for congestion, ear pain, rhinorrhea and sore throat.   Eyes: Positive for discharge.  Respiratory: Positive for cough and  wheezing. Negative for shortness of breath.   Cardiovascular: Negative for chest pain.  Gastrointestinal: Negative for abdominal pain, nausea and vomiting.  Musculoskeletal: Positive for myalgias.     Physical Exam Updated Vital Signs BP 131/84 (BP Location: Left Arm)   Pulse 94   Temp 99.2 F (37.3 C) (Oral)   Resp 20   Wt 102.5 kg   LMP 07/08/2016   SpO2 100%   BMI 40.03 kg/m   Physical Exam  Constitutional: She appears well-developed and well-nourished. No distress.  HENT:  Head: Normocephalic and atraumatic.  Right Ear: Tympanic membrane normal.  Left Ear: Tympanic membrane normal.  Nose: Mucosal edema and rhinorrhea present.  Mouth/Throat: Uvula is midline and mucous membranes are normal. No trismus in the jaw. Posterior oropharyngeal erythema present. No tonsillar exudate.  Right sided nasal mucosal edema. Nasal discharge appreciated. No trismus. Uvula midline and rises symmetrically. Mild posterior oropharynx erythema. No tonsillar hypertrophy or exudate.   Eyes: Conjunctivae and EOM are normal. Pupils are equal, round, and reactive to light. Right eye exhibits no discharge. Left eye exhibits no discharge. No scleral icterus.  Neck: Normal range of motion. Neck supple. No neck rigidity. Normal range of motion present.  No nuchal rigidity.   Cardiovascular: Normal rate, regular rhythm, normal heart sounds and intact distal pulses.   No murmur heard. Pulmonary/Chest: Effort normal and breath sounds normal. No respiratory distress. She has no wheezes. She has no rales.  Abdominal: Soft. She exhibits no distension. There is no tenderness.  Lymphadenopathy:    She has no cervical adenopathy.  Neurological: She is alert.  Skin: Skin is warm and dry. She is not diaphoretic.  Psychiatric: She has a normal mood and affect. Her behavior is normal.     ED Treatments / Results  Labs (all labs ordered are listed, but only abnormal results are displayed) Labs Reviewed - No  data to display  EKG  EKG Interpretation None       Radiology Dg Chest 2 View  Result Date: 07/25/2016 CLINICAL DATA:  Cough and fever for 1 day. EXAM: CHEST  2 VIEW COMPARISON:  PA and lateral chest 07/06/2008. FINDINGS: Lungs are clear. Heart size is normal. No pneumothorax or pleural effusion. Scoliosis noted. IMPRESSION: No acute disease. Electronically Signed   By: Drusilla Kannerhomas  Dalessio M.D.   On: 07/25/2016 21:25    Procedures Procedures (including critical care time)  Medications Ordered in ED Medications  acetaminophen (TYLENOL) tablet 650 mg (650 mg Oral Given 07/25/16 2115)  ibuprofen (ADVIL,MOTRIN) tablet 600 mg (600 mg Oral Given 07/25/16 2230)     Initial Impression /  Assessment and Plan / ED Course  I have reviewed the triage vital signs and the nursing notes.  Pertinent labs & imaging results that were available during my care of the patient were reviewed by me and considered in my medical decision making (see chart for details).  Clinical Course as of Jul 26 33  Thu Jul 25, 2016  2140 DG Chest 2 View [AM]    Clinical Course User Index [AM] Lona Kettle, [place]    Patient presents to ED with complaint or fever and URI like sxs. Patient is afebrile and non-toxic appearing in NAD. VSS. Nasal mucosal edema and discharge noted. No trismus. Mild posterior oropharynx erythema. Uvula midline and rises symmetric. No tonsillar hypertrophy or exudate. No nuchal rigidity. Lungs CTABL. Heart RRR. Centor score 0, low suspicion for strep at this time. CXR negative for acute infiltrate.  Patient with symptoms consistent with influenza. Vitals are stable, afebrile in ED. No signs of dehydration, tolerating PO's.  Lungs are clear.  Patient will be discharged with instructions to orally hydrate, rest, and use over-the-counter medications such as anti-inflammatories ibuprofen for muscle aches and Tylenol for fever. Utilize inhaler as needed for wheezing. Rx tessalon perles for  cough. Follow up with PCP. Strict return precautions given. Pt voiced understanding and is agreeable.    Final Clinical Impressions(s) / ED Diagnoses   Final diagnoses:  Influenza-like illness    New Prescriptions Discharge Medication List as of 07/25/2016 10:28 PM    START taking these medications   Details  benzonatate (TESSALON) 100 MG capsule Take 1 capsule (100 mg total) by mouth every 8 (eight) hours., Starting Thu 07/25/2016, Print         Lona Kettle, PA-C 07/26/16 0035    Lona Kettle, PA-C 07/26/16 1610    Raeford Razor, MD 07/30/16 1135

## 2016-07-25 NOTE — ED Triage Notes (Signed)
Pt c/o fever, chills, cold symptoms, and flu like symptoms. Also reports that almost every family member has had similar symptoms for the last week. Denies chest pain or shortness of breath.

## 2016-07-25 NOTE — Discharge Instructions (Signed)
Read the information below.  Your chest x-ray did not show any signs of pneumonia.  You can take tylenol 650mg  eery 6hrs or motrin 400mg  every 6hrs for pain and fever reduction.  For sore throat - warm salt water gargles, throat lozenges, warm liquids.  For cough - cough drops, honey, warm liquids, humidiers.  I have prescribed tessalon perles for cough relief.  It is very important that you stay well hydrated - water, gatorade, etc. Get plenty of rest. Wash hands regularly.  Do not return to work until fever free for 24 hours.  Use the prescribed medication as directed.  Please discuss all new medications with your pharmacist.   Please follow up with your primary doctor if symptoms persist.  You may return to the Emergency Department at any time for worsening condition or any new symptoms that concern you. Return to ED if your feer persists for more than 5 days, you develop difficulty breathing, chest pain, become dehydrated (decreased urine output, dry mouth), confused, increasing weakness, or any other new/concerning symptoms.

## 2016-10-22 ENCOUNTER — Other Ambulatory Visit (INDEPENDENT_AMBULATORY_CARE_PROVIDER_SITE_OTHER): Payer: Self-pay | Admitting: Specialist

## 2016-10-22 NOTE — Telephone Encounter (Signed)
Gabapentin refill request 

## 2017-02-22 ENCOUNTER — Emergency Department (HOSPITAL_COMMUNITY)
Admission: EM | Admit: 2017-02-22 | Discharge: 2017-02-22 | Disposition: A | Discharge status: Home / Self Care | Payer: BLUE CROSS/BLUE SHIELD | Attending: Emergency Medicine | Admitting: Emergency Medicine

## 2017-02-22 ENCOUNTER — Encounter (HOSPITAL_COMMUNITY): Payer: Self-pay | Admitting: Emergency Medicine

## 2017-02-22 ENCOUNTER — Emergency Department (HOSPITAL_COMMUNITY): Payer: BLUE CROSS/BLUE SHIELD

## 2017-02-22 DIAGNOSIS — S39012A Strain of muscle, fascia and tendon of lower back, initial encounter: Secondary | ICD-10-CM | POA: Diagnosis not present

## 2017-02-22 DIAGNOSIS — Z79899 Other long term (current) drug therapy: Secondary | ICD-10-CM | POA: Diagnosis not present

## 2017-02-22 DIAGNOSIS — Y999 Unspecified external cause status: Secondary | ICD-10-CM | POA: Insufficient documentation

## 2017-02-22 DIAGNOSIS — M545 Low back pain, unspecified: Secondary | ICD-10-CM

## 2017-02-22 DIAGNOSIS — X58XXXA Exposure to other specified factors, initial encounter: Secondary | ICD-10-CM | POA: Insufficient documentation

## 2017-02-22 DIAGNOSIS — Y929 Unspecified place or not applicable: Secondary | ICD-10-CM | POA: Diagnosis not present

## 2017-02-22 DIAGNOSIS — I10 Essential (primary) hypertension: Secondary | ICD-10-CM | POA: Diagnosis not present

## 2017-02-22 DIAGNOSIS — R1084 Generalized abdominal pain: Secondary | ICD-10-CM | POA: Diagnosis present

## 2017-02-22 DIAGNOSIS — D649 Anemia, unspecified: Secondary | ICD-10-CM | POA: Insufficient documentation

## 2017-02-22 DIAGNOSIS — Y9389 Activity, other specified: Secondary | ICD-10-CM | POA: Insufficient documentation

## 2017-02-22 DIAGNOSIS — J45909 Unspecified asthma, uncomplicated: Secondary | ICD-10-CM | POA: Diagnosis not present

## 2017-02-22 LAB — URINALYSIS, ROUTINE W REFLEX MICROSCOPIC
BILIRUBIN URINE: NEGATIVE
Glucose, UA: NEGATIVE mg/dL
Hgb urine dipstick: NEGATIVE
Ketones, ur: NEGATIVE mg/dL
LEUKOCYTES UA: NEGATIVE
Nitrite: NEGATIVE
PH: 5 (ref 5.0–8.0)
Protein, ur: NEGATIVE mg/dL
SPECIFIC GRAVITY, URINE: 1.023 (ref 1.005–1.030)

## 2017-02-22 LAB — POC URINE PREG, ED: Preg Test, Ur: NEGATIVE

## 2017-02-22 MED ORDER — SODIUM CHLORIDE 0.9 % IV BOLUS (SEPSIS)
1000.0000 mL | Freq: Once | INTRAVENOUS | Status: AC
  Administered 2017-02-22: 1000 mL via INTRAVENOUS

## 2017-02-22 MED ORDER — METHOCARBAMOL 500 MG PO TABS: 500.0000 mg | ORAL_TABLET | Freq: Two times a day (BID) | ORAL | 0 refills | Status: DC

## 2017-02-22 MED ORDER — METHOCARBAMOL 500 MG PO TABS
1000.0000 mg | ORAL_TABLET | Freq: Once | ORAL | Status: AC
Start: 2017-02-22 — End: 2017-02-22
  Administered 2017-02-22: 1000 mg via ORAL
  Filled 2017-02-22: qty 2

## 2017-02-22 MED ORDER — IBUPROFEN 600 MG PO TABS: 600.0000 mg | ORAL_TABLET | Freq: Four times a day (QID) | ORAL | 0 refills | Status: DC | PRN

## 2017-02-22 MED ORDER — KETOROLAC TROMETHAMINE 30 MG/ML IJ SOLN
30.0000 mg | Freq: Once | INTRAMUSCULAR | Status: AC
Start: 2017-02-22 — End: 2017-02-22
  Administered 2017-02-22: 30 mg via INTRAVENOUS
  Filled 2017-02-22: qty 1

## 2017-02-22 MED ORDER — ONDANSETRON HCL 4 MG/2ML IJ SOLN
4.0000 mg | Freq: Once | INTRAMUSCULAR | Status: AC
  Administered 2017-02-22: 4 mg via INTRAVENOUS
  Filled 2017-02-22: qty 2

## 2017-02-22 NOTE — ED Notes (Signed)
Pt transported to CT ?

## 2017-02-22 NOTE — Discharge Instructions (Signed)
Please read and follow all provided instructions.  Your diagnoses today include:  1. Strain of lumbar region, initial encounter   2. Acute left-sided low back pain without sciatica     Tests performed today include: Vital signs - see below for your results today  Medications prescribed:   Take any prescribed medications only as directed.  Home care instructions:  Follow any educational materials contained in this packet Please rest, use ice or heat on your back for the next several days Do not lift, push, pull anything more than 10 pounds for the next week  Follow-up instructions: Please follow-up with your primary care provider in the next 1 week for further evaluation of your symptoms.   Return instructions:  SEEK IMMEDIATE MEDICAL ATTENTION IF YOU HAVE: New numbness, tingling, weakness, or problem with the use of your arms or legs Severe back pain not relieved with medications Loss control of your bowels or bladder Increasing pain in any areas of the body (such as chest or abdominal pain) Shortness of breath, dizziness, or fainting.  Worsening nausea (feeling sick to your stomach), vomiting, fever, or sweats Any other emergent concerns regarding your health   Additional Information:  Your vital signs today were: BP 133/65    Pulse 63    Temp 97.8 F (36.6 C) (Oral)    Resp 16    SpO2 100%  If your blood pressure (BP) was elevated above 135/85 this visit, please have this repeated by your doctor within one month. --------------

## 2017-02-22 NOTE — ED Triage Notes (Signed)
Pt c/o L flank pain x1 week states shes had a kidney stone and this feels like it.

## 2017-02-22 NOTE — ED Provider Notes (Signed)
[facility]-EMERGENCY DEPT Provider Note   CSN: [id number] Arrival date & time: 02/22/17  0850     History   Chief Complaint Chief Complaint  Patient presents with  . Flank Pain    HPI Nicole [name] is a 36 y.o. female.  HPI  36 y.o. female with a hx of HTN, presents to the Emergency Department today due to left flank pain x 1 week. Notes pain was coming and going at first, but is now constant this morning. Noted hx kidney stones and states that it feels similar. Rates pain 7/10. Aching sensation. Noted dysuria without hematuria. Notes nausea without emesis. No CP/SOB. No vaginal bleeding/discharge. No meds PTA. No loss of bowel or bladder function. No saddle anesthesia. No other symptoms noted.   Past Medical History:  Diagnosis Date  . Abdominal pain   . Anemia   . Asthma   . Blues 04/05/09  . Depression 04/13/2009  . GERD (gastroesophageal reflux disease)   . H/O endometritis 03/20/09  . H/O migraine   . History of kidney stones      x 3  . Hypertension   . Kidney stone   . Kidney stones   . Pregnancy induced hypertension 03/10/2009  . Trichomonas 2005  . Wears glasses     Patient Active Problem List   Diagnosis Date Noted  . NEUROPATHY-PERIPHERAL 11/06/2006  . ALLERGIC RHINITIS 11/06/2006  . NEPHROLITHIASIS 11/06/2006  . ABNORMAL BLOOD CHEMISTRY NEC 11/06/2006    Past Surgical History:  Procedure Laterality Date  . CHOLECYSTECTOMY  05/04/2012   Procedure: LAPAROSCOPIC CHOLECYSTECTOMY WITH INTRAOPERATIVE CHOLANGIOGRAM;  Surgeon: Romie Levee, MD;  Location: [facility];  Service: General;  Laterality: N/A;  Laparoscopic cholecysectomy with intraoperative cholangiogram  . KIDNEY STONE SURGERY  2005 & 2007  . LITHOTRIPSY    . surgery on left leg     2009  . THERAPEUTIC ABORTION  2000  . WISDOM TOOTH EXTRACTION  2005 & 2007    OB History    Gravida Para Term Preterm AB Living   3 2 2   1 2    SAB TAB Ectopic Multiple Live Births     1     2        Home Medications    Prior to Admission medications   Medication Sig Start Date End Date Taking? Authorizing Provider  benzonatate (TESSALON) 100 MG capsule Take 1 capsule (100 mg total) by mouth every 8 (eight) hours. 07/25/16   Deborha Payment, PA-C  ciprofloxacin (CIPRO) 500 MG tablet Take 1 tablet (500 mg total) by mouth 2 (two) times daily. 02/08/15   Thressa Sheller D, CNM  gabapentin (NEURONTIN) 100 MG capsule Take 1 capsule (100 mg total) by mouth 2 (two) times daily. 06/18/16   Kerrin Champagne, MD  gabapentin (NEURONTIN) 300 MG capsule TAKE ONE CAPSULE BY MOUTH 3 TIMES A DAY 10/23/16   Kerrin Champagne, MD  losartan (COZAAR) 50 MG tablet Take 50 mg by mouth daily.    [provider]  meloxicam (MOBIC) 15 MG tablet Take 1 tablet (15 mg total) by mouth daily. 06/18/16   Kerrin Champagne, MD  methocarbamol (ROBAXIN) 500 MG tablet Take 1 tablet (500 mg total) by mouth 2 (two) times daily. 05/20/15   Palumbo, April, MD  omeprazole (PRILOSEC OTC) 20 MG tablet Take 20 mg by mouth daily.    [provider]  phenazopyridine (PYRIDIUM) 200 MG tablet Take 200 mg by mouth 3 (three) times daily as  needed for pain.    [provider]  traMADol (ULTRAM) 50 MG tablet Take 1 tablet (50 mg total) by mouth every 6 (six) hours as needed. 06/18/16   Kerrin ChampagneNitka, James E, MD    Family History Family History  Problem Relation Age of Onset  . Hypertension Mother   . Cancer Sister        Leukemia  . Alcohol abuse Brother     Social History Social History  Substance Use Topics  . Smoking status: Never Smoker  . Smokeless tobacco: Never Used  . Alcohol use No     Allergies   Oxycodone   Review of Systems Review of Systems ROS reviewed and all are negative for acute change except as noted in the HPI.  Physical Exam Updated Vital Signs BP (!) 142/79   Pulse 78   Temp 97.8 F (36.6 C) (Oral)   Resp 16   SpO2 100%   Physical Exam  Constitutional: She is oriented to  person, place, and time. Vital signs are normal. She appears well-developed and well-nourished.  HENT:  Head: Normocephalic and atraumatic.  Right Ear: Hearing normal.  Left Ear: Hearing normal.  Eyes: Pupils are equal, round, and reactive to light. Conjunctivae and EOM are normal.  Neck: Normal range of motion. Neck supple.  Cardiovascular: Normal rate, regular rhythm, normal heart sounds and intact distal pulses.   Pulmonary/Chest: Effort normal and breath sounds normal.  Abdominal: Soft. Normal appearance and bowel sounds are normal. There is CVA tenderness (left). There is no rigidity, no rebound, no guarding, no tenderness at McBurney's point and negative Murphy's sign.  Musculoskeletal: Normal range of motion.  Neurological: She is alert and oriented to person, place, and time.  Skin: Skin is warm and dry.  Psychiatric: She has a normal mood and affect. Her speech is normal and behavior is normal. Thought content normal.  Nursing note and vitals reviewed.  ED Treatments / Results  Labs (all labs ordered are listed, but only abnormal results are displayed) Labs Reviewed  URINALYSIS, ROUTINE W REFLEX MICROSCOPIC  POC URINE PREG, ED    EKG  EKG Interpretation None       Radiology Ct Renal Stone Study  Result Date: 02/22/2017 CLINICAL DATA:  Left flank pain EXAM: CT ABDOMEN AND PELVIS WITHOUT CONTRAST TECHNIQUE: Multidetector CT imaging of the abdomen and pelvis was performed following the standard protocol without IV contrast. COMPARISON:  05/20/2015 FINDINGS: Lower chest: Lung bases are clear. No effusions. Heart is normal size. Hepatobiliary: No focal liver abnormality is seen. Status post cholecystectomy. No biliary dilatation. Pancreas: No focal abnormality or ductal dilatation. Spleen: No focal abnormality.  Normal size. Adrenals/Urinary Tract: No adrenal abnormality. No focal renal abnormality. No stones or hydronephrosis. Urinary bladder is unremarkable. Stomach/Bowel:  Stomach, large and small bowel grossly unremarkable. Vascular/Lymphatic: No evidence of aneurysm or adenopathy. Reproductive: Uterus and adnexa unremarkable. No mass. IUD in place. Other: No free fluid or free air. Small umbilical hernia containing fat. Musculoskeletal: No acute bony abnormality. IMPRESSION: No renal or ureteral stones.  No hydronephrosis. No acute findings in the abdomen or pelvis. Electronically Signed   By: Charlett NoseKevin  Dover M.D.   On: 02/22/2017 10:28    Procedures Procedures (including critical care time)  Medications Ordered in ED Medications  sodium chloride 0.9 % bolus 1,000 mL (1,000 mLs Intravenous New Bag/Given 02/22/17 1004)  ketorolac (TORADOL) 30 MG/ML injection 30 mg (30 mg Intravenous Given 02/22/17 1003)  ondansetron (ZOFRAN) injection 4 mg (4 mg  Intravenous Given 02/22/17 1003)   Initial Impression / Assessment and Plan / ED Course  I have reviewed the triage vital signs and the nursing notes.  Pertinent labs & imaging results that were available during my care of the patient were reviewed by me and considered in my medical decision making (see chart for details).  Final Clinical Impressions(s) / ED Diagnoses  {I have reviewed and evaluated the relevant laboratory values. {I have reviewed and evaluated the relevant imaging studies.  {I have reviewed the relevant previous healthcare records.  {I obtained HPI from historian.   ED Course:  Assessment: Pt is a 36 y.o. female with a hx of HTN, presents to the Emergency Department today due to left flank pain x 1 week. Notes pain was coming and going at first, but is now constant this morning. Noted hx kidney stones and states that it feels similar. Rates pain 7/10. Aching sensation. Noted dysuria without hematuria. Notes nausea without emesis. No CP/SOB. States pain feels like it radiates to LLQ. No vaginal bleeding/discharge. No meds PTA..  On exam, pt in NAD. Nontoxic/nonseptic appearing. VSS. Afebrile. Lungs CTA. Heart  RRR. Abdomen nontender soft. Noted TTP along left lower lumbar spinous musculature. UA unremarkable. CT Renal Stone negative. Discussed with patient about urinary symptoms and she states that it hurts after holding urine. Will send urine culture and treat for suspect musculoskeletal etiology of pain. Plan is to DC home with follow up to PCP. At time of discharge, Patient is in no acute distress. Vital Signs are stable. Patient is able to ambulate. Patient able to tolerate PO.    Disposition/Plan:  DC Home Additional Verbal discharge instructions given and discussed with patient.  Pt Instructed to f/u with PCP in the next week for evaluation and treatment of symptoms. Return precautions given Pt acknowledges and agrees with plan  Supervising Physician Vanetta Mulders, MD  Final diagnoses:  Strain of lumbar region, initial encounter  Acute left-sided low back pain without sciatica    New Prescriptions New Prescriptions   No medications on file       Audry Pili, Cordelia Poche 02/22/17 1059    Vanetta Mulders, MD 02/22/17 1116

## 2017-03-26 ENCOUNTER — Encounter (HOSPITAL_COMMUNITY): Payer: Self-pay | Admitting: Emergency Medicine

## 2017-03-26 ENCOUNTER — Emergency Department (HOSPITAL_COMMUNITY): Payer: BLUE CROSS/BLUE SHIELD

## 2017-03-26 ENCOUNTER — Emergency Department (HOSPITAL_COMMUNITY)
Admission: EM | Admit: 2017-03-26 | Discharge: 2017-03-26 | Disposition: A | Discharge status: Home / Self Care | Payer: BLUE CROSS/BLUE SHIELD | Attending: Emergency Medicine | Admitting: Emergency Medicine

## 2017-03-26 DIAGNOSIS — Z79899 Other long term (current) drug therapy: Secondary | ICD-10-CM | POA: Diagnosis not present

## 2017-03-26 DIAGNOSIS — J45909 Unspecified asthma, uncomplicated: Secondary | ICD-10-CM | POA: Insufficient documentation

## 2017-03-26 DIAGNOSIS — I10 Essential (primary) hypertension: Secondary | ICD-10-CM | POA: Diagnosis not present

## 2017-03-26 DIAGNOSIS — R51 Headache: Secondary | ICD-10-CM | POA: Diagnosis present

## 2017-03-26 LAB — I-STAT TROPONIN, ED: Troponin i, poc: 0 ng/mL (ref 0.00–0.08)

## 2017-03-26 LAB — CBC WITH DIFFERENTIAL/PLATELET
Basophils Absolute: 0 10*3/uL (ref 0.0–0.1)
Basophils Relative: 0 %
EOS PCT: 1 %
Eosinophils Absolute: 0.1 10*3/uL (ref 0.0–0.7)
HCT: 36.3 % (ref 36.0–46.0)
Hemoglobin: 11.8 g/dL — ABNORMAL LOW (ref 12.0–15.0)
LYMPHS ABS: 3 10*3/uL (ref 0.7–4.0)
LYMPHS PCT: 39 %
MCH: 27.3 pg (ref 26.0–34.0)
MCHC: 32.5 g/dL (ref 30.0–36.0)
MCV: 83.8 fL (ref 78.0–100.0)
MONO ABS: 0.6 10*3/uL (ref 0.1–1.0)
MONOS PCT: 8 %
Neutro Abs: 4 10*3/uL (ref 1.7–7.7)
Neutrophils Relative %: 52 %
PLATELETS: 228 10*3/uL (ref 150–400)
RBC: 4.33 MIL/uL (ref 3.87–5.11)
RDW: 14.5 % (ref 11.5–15.5)
WBC: 7.7 10*3/uL (ref 4.0–10.5)

## 2017-03-26 LAB — I-STAT CHEM 8, ED
BUN: 7 mg/dL (ref 6–20)
CALCIUM ION: 1.15 mmol/L (ref 1.15–1.40)
CHLORIDE: 105 mmol/L (ref 101–111)
Creatinine, Ser: 0.5 mg/dL (ref 0.44–1.00)
GLUCOSE: 105 mg/dL — AB (ref 65–99)
HCT: 35 % — ABNORMAL LOW (ref 36.0–46.0)
HEMOGLOBIN: 11.9 g/dL — AB (ref 12.0–15.0)
Potassium: 3.9 mmol/L (ref 3.5–5.1)
Sodium: 145 mmol/L (ref 135–145)
TCO2: 24 mmol/L (ref 0–100)

## 2017-03-26 LAB — URINALYSIS, ROUTINE W REFLEX MICROSCOPIC
Bilirubin Urine: NEGATIVE
Glucose, UA: NEGATIVE mg/dL
HGB URINE DIPSTICK: NEGATIVE
Ketones, ur: NEGATIVE mg/dL
Leukocytes, UA: NEGATIVE
Nitrite: NEGATIVE
Protein, ur: NEGATIVE mg/dL
SPECIFIC GRAVITY, URINE: 1.014 (ref 1.005–1.030)
pH: 7 (ref 5.0–8.0)

## 2017-03-26 LAB — HCG, QUANTITATIVE, PREGNANCY

## 2017-03-26 LAB — I-STAT BETA HCG BLOOD, ED (MC, WL, AP ONLY): HCG, QUANTITATIVE: 5.1 m[IU]/mL — AB (ref <5)

## 2017-03-26 NOTE — ED Provider Notes (Addendum)
[facility]-EMERGENCY DEPT Provider Note   CSN: [id number] Arrival date & time: 03/26/17  0000     History   Chief Complaint Chief Complaint  Patient presents with  . Hypertension    HPI Nicole [name] is a 36 y.o. female.  The history is provided by the patient.  Hypertension  This is a chronic problem. The current episode started more than 1 week ago. The problem occurs constantly. The problem has been gradually worsening. Associated symptoms include headaches. Pertinent negatives include no chest pain, no abdominal pain and no shortness of breath. Nothing aggravates the symptoms. Nothing relieves the symptoms. Treatments tried: new bp medication. The treatment provided no relief.    Past Medical History:  Diagnosis Date  . Abdominal pain   . Anemia   . Asthma   . Blues 04/05/09  . Depression 04/13/2009  . GERD (gastroesophageal reflux disease)   . H/O endometritis 03/20/09  . H/O migraine   . History of kidney stones      x 3  . Hypertension   . Kidney stone   . Kidney stones   . Pregnancy induced hypertension 03/10/2009  . Trichomonas 2005  . Wears glasses     Patient Active Problem List   Diagnosis Date Noted  . NEUROPATHY-PERIPHERAL 11/06/2006  . ALLERGIC RHINITIS 11/06/2006  . NEPHROLITHIASIS 11/06/2006  . ABNORMAL BLOOD CHEMISTRY NEC 11/06/2006    Past Surgical History:  Procedure Laterality Date  . CHOLECYSTECTOMY  05/04/2012   Procedure: LAPAROSCOPIC CHOLECYSTECTOMY WITH INTRAOPERATIVE CHOLANGIOGRAM;  Surgeon: Romie Levee, MD;  [facility];  Service: General;  Laterality: N/A;  Laparoscopic cholecysectomy with intraoperative cholangiogram  . KIDNEY STONE SURGERY  2005 & 2007  . LITHOTRIPSY    . surgery on left leg     2009  . THERAPEUTIC ABORTION  2000  . WISDOM TOOTH EXTRACTION  2005 & 2007    OB History    Gravida Para Term Preterm AB Living   3 2 2   1 2    SAB TAB Ectopic Multiple Live Births     1     2       Home  Medications    Prior to Admission medications   Medication Sig Start Date End Date Taking? Authorizing Provider  losartan (COZAAR) 50 MG tablet Take 50 mg by mouth daily.   Yes [provider]  omeprazole (PRILOSEC OTC) 20 MG tablet Take 20 mg by mouth daily.   Yes [provider]  benzonatate (TESSALON) 100 MG capsule Take 1 capsule (100 mg total) by mouth every 8 (eight) hours. Patient not taking: Reported on 03/26/2017 07/25/16   Deborha Payment, PA-C  ciprofloxacin (CIPRO) 500 MG tablet Take 1 tablet (500 mg total) by mouth 2 (two) times daily. Patient not taking: Reported on 03/26/2017 02/08/15   Thressa Sheller D, CNM  gabapentin (NEURONTIN) 100 MG capsule Take 1 capsule (100 mg total) by mouth 2 (two) times daily. Patient not taking: Reported on 03/26/2017 06/18/16   Kerrin Champagne, MD  gabapentin (NEURONTIN) 300 MG capsule TAKE ONE CAPSULE BY MOUTH 3 TIMES A DAY Patient not taking: Reported on 03/26/2017 10/23/16   Kerrin Champagne, MD  ibuprofen (ADVIL,MOTRIN) 600 MG tablet Take 1 tablet (600 mg total) by mouth every 6 (six) hours as needed. Patient not taking: Reported on 03/26/2017 02/22/17   Audry Pili, PA-C  meloxicam (MOBIC) 15 MG tablet Take 1 tablet (15 mg total) by mouth daily. Patient not taking: Reported  on 03/26/2017 06/18/16   Kerrin ChampagneNitka, James E, MD  methocarbamol (ROBAXIN) 500 MG tablet Take 1 tablet (500 mg total) by mouth 2 (two) times daily. Patient not taking: Reported on 03/26/2017 02/22/17   Audry PiliMohr, Tyler, PA-C  traMADol (ULTRAM) 50 MG tablet Take 1 tablet (50 mg total) by mouth every 6 (six) hours as needed. Patient not taking: Reported on 03/26/2017 06/18/16   Kerrin ChampagneNitka, James E, MD    Family History Family History  Problem Relation Age of Onset  . Hypertension Mother   . Cancer Sister        Leukemia  . Alcohol abuse Brother     Social History Social History  Substance Use Topics  . Smoking status: Never Smoker  . Smokeless tobacco: Never Used  . Alcohol  use No     Allergies   Oxycodone   Review of Systems Review of Systems  Constitutional: Negative for fever.  Respiratory: Negative for shortness of breath.   Cardiovascular: Negative for chest pain, palpitations and leg swelling.  Gastrointestinal: Negative for abdominal pain.  Neurological: Positive for headaches. Negative for dizziness, tremors, seizures, syncope, facial asymmetry, speech difficulty, weakness, light-headedness and numbness.  All other systems reviewed and are negative.    Physical Exam Updated Vital Signs BP (!) 142/94 (BP Location: Right Arm)   Pulse 74   Temp 99.3 F (37.4 C) (Oral)   Resp 18   SpO2 100%   Physical Exam  Constitutional: She is oriented to person, place, and time. She appears well-developed and well-nourished. No distress.  Intact cognition  HENT:  Head: Normocephalic and atraumatic.  Right Ear: External ear normal.  Left Ear: External ear normal.  Nose: Nose normal.  Mouth/Throat: Oropharynx is clear and moist.  No proptosis, EOMI no papilledema  Eyes: Pupils are equal, round, and reactive to light. Conjunctivae and EOM are normal.  Neck: Normal range of motion. Neck supple. No JVD present.  Cardiovascular: Normal rate, regular rhythm, normal heart sounds and intact distal pulses.   Pulmonary/Chest: Effort normal and breath sounds normal. No stridor. She has no wheezes. She has no rales.  Abdominal: Soft. Bowel sounds are normal. She exhibits no mass. There is no tenderness. There is no rebound and no guarding.  Musculoskeletal: Normal range of motion. She exhibits no edema, tenderness or deformity.  Neurological: She is alert and oriented to person, place, and time. She displays normal reflexes. No cranial nerve deficit or sensory deficit. She exhibits normal muscle tone. Coordination normal.  5/5 throughout with intact sensation throughout  Skin: Skin is warm and dry. Capillary refill takes less than 2 seconds. She is not  diaphoretic.  Psychiatric: She has a normal mood and affect.     ED Treatments / Results   Vitals:   03/26/17 0305 03/26/17 0515  BP: (!) 151/92 (!) 142/94  Pulse: 71 74  Resp: 16 18  Temp:    SpO2: 100% 100%    Labs (all labs ordered are listed, but only abnormal results are displayed)  Results for orders placed or performed during the hospital encounter of 03/26/17  CBC with Differential/Platelet  Result Value Ref Range   WBC 7.7 4.0 - 10.5 K/uL   RBC 4.33 3.87 - 5.11 MIL/uL   Hemoglobin 11.8 (L) 12.0 - 15.0 g/dL   HCT 16.136.3 09.636.0 - 04.546.0 %   MCV 83.8 78.0 - 100.0 fL   MCH 27.3 26.0 - 34.0 pg   MCHC 32.5 30.0 - 36.0 g/dL   RDW 40.914.5 81.111.5 -  15.5 %   Platelets 228 150 - 400 K/uL   Neutrophils Relative % 52 %   Neutro Abs 4.0 1.7 - 7.7 K/uL   Lymphocytes Relative 39 %   Lymphs Abs 3.0 0.7 - 4.0 K/uL   Monocytes Relative 8 %   Monocytes Absolute 0.6 0.1 - 1.0 K/uL   Eosinophils Relative 1 %   Eosinophils Absolute 0.1 0.0 - 0.7 K/uL   Basophils Relative 0 %   Basophils Absolute 0.0 0.0 - 0.1 K/uL  Urinalysis, Routine w reflex microscopic  Result Value Ref Range   Color, Urine YELLOW YELLOW   APPearance CLEAR CLEAR   Specific Gravity, Urine 1.014 1.005 - 1.030   pH 7.0 5.0 - 8.0   Glucose, UA NEGATIVE NEGATIVE mg/dL   Hgb urine dipstick NEGATIVE NEGATIVE   Bilirubin Urine NEGATIVE NEGATIVE   Ketones, ur NEGATIVE NEGATIVE mg/dL   Protein, ur NEGATIVE NEGATIVE mg/dL   Nitrite NEGATIVE NEGATIVE   Leukocytes, UA NEGATIVE NEGATIVE  hCG, quantitative, pregnancy  Result Value Ref Range   hCG, Beta Chain, Quant, S <1 <5 mIU/mL  I-Stat Chem 8, ED  Result Value Ref Range   Sodium 145 135 - 145 mmol/L   Potassium 3.9 3.5 - 5.1 mmol/L   Chloride 105 101 - 111 mmol/L   BUN 7 6 - 20 mg/dL   Creatinine, Ser 1.61 0.44 - 1.00 mg/dL   Glucose, Bld 096 (H) 65 - 99 mg/dL   Calcium, Ion 0.45 4.09 - 1.40 mmol/L   TCO2 24 0 - 100 mmol/L   Hemoglobin 11.9 (L) 12.0 - 15.0 g/dL    HCT 81.1 (L) 91.4 - 46.0 %  I-Stat Beta hCG blood, ED ([facility], [facility], [facility] only)  Result Value Ref Range   I-stat hCG, quantitative 5.1 (H) <5 mIU/mL   Comment 3          I-stat troponin, ED  Result Value Ref Range   Troponin i, poc 0.00 0.00 - 0.08 ng/mL   Comment 3           Ct Head Wo Contrast  Result Date: 03/26/2017 CLINICAL DATA:  Hypertensive, dizziness, extremity numbness and headache for 1 week. EXAM: CT HEAD WITHOUT CONTRAST TECHNIQUE: Contiguous axial images were obtained from the base of the skull through the vertex without intravenous contrast. COMPARISON:  None. FINDINGS: BRAIN: No intraparenchymal hemorrhage, mass effect nor midline shift. The ventricles and sulci are normal. No acute large vascular territory infarcts. No abnormal extra-axial fluid collections. Basal cisterns are patent. VASCULAR: Unremarkable. SKULL/SOFT TISSUES: No skull fracture. No significant soft tissue swelling. ORBITS/SINUSES: The included ocular globes and orbital contents are normal.The mastoid aircells and included paranasal sinuses are well-aerated. OTHER: None. IMPRESSION: Normal noncontrast CT HEAD. Electronically Signed   By: Awilda Metro M.D.   On: 03/26/2017 05:08    EKG  EKG Interpretation  Date/Time:  Wednesday March 26 2017 03:47:16 EDT Ventricular Rate:  75 PR Interval:    QRS Duration: 97 QT Interval:  393 QTC Calculation: 439 R Axis:   12 Text Interpretation:  Sinus rhythm Borderline T abnormalities, iunchanged from 2013 Confirmed by Nicanor Alcon, Tracina Beaumont ([id number]) on 03/26/2017 3:53:36 AM       Radiology Ct Head Wo Contrast  Result Date: 03/26/2017 CLINICAL DATA:  Hypertensive, dizziness, extremity numbness and headache for 1 week. EXAM: CT HEAD WITHOUT CONTRAST TECHNIQUE: Contiguous axial images were obtained from the base of the skull through the vertex without intravenous contrast. COMPARISON:  None. FINDINGS: BRAIN: No intraparenchymal hemorrhage,  mass effect nor midline shift. The  ventricles and sulci are normal. No acute large vascular territory infarcts. No abnormal extra-axial fluid collections. Basal cisterns are patent. VASCULAR: Unremarkable. SKULL/SOFT TISSUES: No skull fracture. No significant soft tissue swelling. ORBITS/SINUSES: The included ocular globes and orbital contents are normal.The mastoid aircells and included paranasal sinuses are well-aerated. OTHER: None. IMPRESSION: Normal noncontrast CT HEAD. Electronically Signed   By: Awilda Metro M.D.   On: 03/26/2017 05:08    Procedures Procedures (including critical care time)     Final Clinical Impressions(s) / ED Diagnoses   Final diagnoses:  Essential hypertension  Denied neurological symptoms.  Has had HA for 3 days.  Is frontal and consistent with tension HA.  Patient states she is under stress.  She has intact sensation and motor to confrontation on exam. I highly doubt stroke as symptoms ongoing x 3 days.  I highly doubt cavernous sinus thrombosis given normal cognition and no papilledema nor proptosis.  BP resolved on own in department.  Will start DASH diet and have patient follow up with PMD for ongoing therapy with BP meds.    The patient is very well appearing and has been observed in the ED.  Strict return precautions given for  intractable Headache, changes in vision or thinking, chest pain, dyspnea on exertion, weakness or numbness or any concerns. No signs of systemic illness or infection. The patient is nontoxic-appearing on exam and vital signs are within normal limits.    I have reviewed the triage vital signs and the nursing notes. Pertinent labs &imaging results that were available during my care of the patient were reviewed by me and considered in my medical decision making (see chart for details).  After history, exam, and medical workup I feel the patient has been appropriately medically screened and is safe for discharge home. Pertinent diagnoses were discussed with the  patient. Patient was given return precautions.   New Prescriptions New Prescriptions   No medications on file     Aneli Zara, MD 03/26/17 1324    Cy Blamer, MD 03/26/17 4010

## 2017-03-26 NOTE — ED Triage Notes (Signed)
Pt from home with c/o htn, headache, and runny nose that began 1 week ago. Pt states about 1 month ago her PCP doubled her htn medication and she states she is compliant. Pt reports today headache worsened and is now 9/10. Pt also reports numbness in right lower leg and right foot. Pt was ambulatory to triage.

## 2017-03-26 NOTE — ED Notes (Signed)
Bed: [facility]11 Expected date:  Expected time:  Means of arrival:  Comments: 

## 2017-03-26 NOTE — ED Notes (Signed)
Bed: [facility] Expected date:  Expected time:  Means of arrival:  Comments: 

## 2017-10-22 ENCOUNTER — Other Ambulatory Visit (INDEPENDENT_AMBULATORY_CARE_PROVIDER_SITE_OTHER): Payer: Self-pay | Admitting: Specialist

## 2017-10-22 NOTE — Telephone Encounter (Signed)
Gabapentin refill request 

## 2018-01-13 ENCOUNTER — Other Ambulatory Visit (INDEPENDENT_AMBULATORY_CARE_PROVIDER_SITE_OTHER): Payer: Self-pay | Admitting: Specialist

## 2018-01-13 NOTE — Telephone Encounter (Signed)
Gabapentin refill request--requesting 90 day supply

## 2018-07-27 ENCOUNTER — Emergency Department (HOSPITAL_COMMUNITY)
Admission: EM | Admit: 2018-07-27 | Discharge: 2018-07-28 | Disposition: A | Discharge status: Home / Self Care | Payer: BLUE CROSS/BLUE SHIELD | Attending: Emergency Medicine | Admitting: Emergency Medicine

## 2018-07-27 ENCOUNTER — Encounter (HOSPITAL_COMMUNITY): Payer: Self-pay | Admitting: Emergency Medicine

## 2018-07-27 DIAGNOSIS — M545 Low back pain: Secondary | ICD-10-CM | POA: Diagnosis not present

## 2018-07-27 DIAGNOSIS — I10 Essential (primary) hypertension: Secondary | ICD-10-CM | POA: Insufficient documentation

## 2018-07-27 DIAGNOSIS — R110 Nausea: Secondary | ICD-10-CM | POA: Insufficient documentation

## 2018-07-27 DIAGNOSIS — R1012 Left upper quadrant pain: Secondary | ICD-10-CM | POA: Diagnosis not present

## 2018-07-27 DIAGNOSIS — J45909 Unspecified asthma, uncomplicated: Secondary | ICD-10-CM | POA: Diagnosis not present

## 2018-07-27 DIAGNOSIS — Z79899 Other long term (current) drug therapy: Secondary | ICD-10-CM | POA: Diagnosis not present

## 2018-07-27 LAB — BASIC METABOLIC PANEL
Anion gap: 8 (ref 5–15)
BUN: 9 mg/dL (ref 6–20)
CO2: 27 mmol/L (ref 22–32)
Calcium: 9.3 mg/dL (ref 8.9–10.3)
Chloride: 106 mmol/L (ref 98–111)
Creatinine, Ser: 0.64 mg/dL (ref 0.44–1.00)
GFR calc Af Amer: >60 mL/min (ref >60)
GLUCOSE: 115 mg/dL — AB (ref 70–99)
POTASSIUM: 3.8 mmol/L (ref 3.5–5.1)
Sodium: 141 mmol/L (ref 135–145)

## 2018-07-27 LAB — POC URINE PREG, ED: Preg Test, Ur: NEGATIVE

## 2018-07-27 LAB — URINALYSIS, ROUTINE W REFLEX MICROSCOPIC
BILIRUBIN URINE: NEGATIVE
GLUCOSE, UA: NEGATIVE mg/dL
HGB URINE DIPSTICK: NEGATIVE
KETONES UR: NEGATIVE mg/dL
Leukocytes, UA: NEGATIVE
Nitrite: NEGATIVE
PH: 6 (ref 5.0–8.0)
Protein, ur: NEGATIVE mg/dL
Specific Gravity, Urine: 1.024 (ref 1.005–1.030)

## 2018-07-27 LAB — CBC
HCT: 38.5 % (ref 36.0–46.0)
Hemoglobin: 11.7 g/dL — ABNORMAL LOW (ref 12.0–15.0)
MCH: 26.2 pg (ref 26.0–34.0)
MCHC: 30.4 g/dL (ref 30.0–36.0)
MCV: 86.3 fL (ref 80.0–100.0)
PLATELETS: 255 10*3/uL (ref 150–400)
RBC: 4.46 MIL/uL (ref 3.87–5.11)
RDW: 14.3 % (ref 11.5–15.5)
WBC: 12.9 10*3/uL — ABNORMAL HIGH (ref 4.0–10.5)
nRBC: 0 % (ref 0.0–0.2)

## 2018-07-27 NOTE — ED Triage Notes (Signed)
Pt reports left sided "side pain".  Reports hx of kidney stones states it started yesterday.  Reports nausea.

## 2018-07-28 ENCOUNTER — Emergency Department (HOSPITAL_COMMUNITY): Payer: BLUE CROSS/BLUE SHIELD

## 2018-07-28 LAB — D-DIMER, QUANTITATIVE: D-Dimer, Quant: 0.43 ug/mL-FEU (ref 0.00–0.50)

## 2018-07-28 MED ORDER — KETOROLAC TROMETHAMINE 30 MG/ML IJ SOLN
30.0000 mg | Freq: Once | INTRAMUSCULAR | Status: AC
  Administered 2018-07-28: 30 mg via INTRAMUSCULAR
  Filled 2018-07-28: qty 1

## 2018-07-28 MED ORDER — ONDANSETRON 4 MG PO TBDP
4.0000 mg | ORAL_TABLET | Freq: Once | ORAL | Status: AC
  Administered 2018-07-28: 4 mg via ORAL
  Filled 2018-07-28: qty 1

## 2018-07-28 MED ORDER — ONDANSETRON 4 MG PO TBDP: 4.0000 mg | ORAL_TABLET | Freq: Three times a day (TID) | ORAL | 0 refills | Status: DC | PRN

## 2018-07-28 MED ORDER — IBUPROFEN 600 MG PO TABS: 600.0000 mg | ORAL_TABLET | Freq: Four times a day (QID) | ORAL | 0 refills | Status: DC | PRN

## 2018-07-28 NOTE — Discharge Instructions (Addendum)
Your testing is reassuring.  There is no evidence of urinary tract infection or kidney stone.  Take the anti-inflammatories and nausea medication as prescribed.  Follow-up with your doctor.  Return to the ED if you develop new or worsening symptoms.

## 2018-07-28 NOTE — ED Provider Notes (Signed)
[facility] EMERGENCY DEPARTMENT Provider Note   CSN: [id number] Arrival date & time: 07/27/18  2153     History   Chief Complaint Chief Complaint  Patient presents with  . Flank Pain    HPI Nicole [name] is a 37 y.o. female.  Patient presents with a 2-day history of left side upper abdominal pain and flank pain that is similar to previous kidney stones.  She reports it waxes and wanes in intensity.  She does not take anything for it at home.  Has had nausea but no vomiting.  No fever.  No pain with urination or blood in the urine.  Has had frequency and urgency.  No diarrhea or constipation.  Pain is in her left upper abdomen and left flank and never goes away but waxes and wanes in intensity.  Denies any vaginal bleeding or discharge.  Denies any chest pain or shortness of breath.  The history is provided by the patient.  Flank Pain  Associated symptoms include abdominal pain. Pertinent negatives include no chest pain, no headaches and no shortness of breath.    Past Medical History:  Diagnosis Date  . Abdominal pain   . Anemia   . Asthma   . Blues 04/05/09  . Depression 04/13/2009  . GERD (gastroesophageal reflux disease)   . H/O endometritis 03/20/09  . H/O migraine   . History of kidney stones      x 3  . Hypertension   . Kidney stone   . Kidney stones   . Pregnancy induced hypertension 03/10/2009  . Trichomonas 2005  . Wears glasses     Patient Active Problem List   Diagnosis Date Noted  . NEUROPATHY-PERIPHERAL 11/06/2006  . ALLERGIC RHINITIS 11/06/2006  . NEPHROLITHIASIS 11/06/2006  . ABNORMAL BLOOD CHEMISTRY NEC 11/06/2006    Past Surgical History:  Procedure Laterality Date  . CHOLECYSTECTOMY  05/04/2012   Procedure: LAPAROSCOPIC CHOLECYSTECTOMY WITH INTRAOPERATIVE CHOLANGIOGRAM;  Surgeon: Romie Levee, MD;  Location: [facility];  Service: General;  Laterality: N/A;  Laparoscopic cholecysectomy with intraoperative  cholangiogram  . KIDNEY STONE SURGERY  2005 & 2007  . LITHOTRIPSY    . surgery on left leg     2009  . THERAPEUTIC ABORTION  2000  . WISDOM TOOTH EXTRACTION  2005 & 2007     OB History    Gravida  3   Para  2   Term  2   Preterm      AB  1   Living  2     SAB      TAB  1   Ectopic      Multiple      Live Births  2            Home Medications    Prior to Admission medications   Medication Sig Start Date End Date Taking? Authorizing Provider  cetirizine (ZYRTEC) 10 MG tablet Take 10 mg by mouth daily.   Yes [provider]  losartan (COZAAR) 50 MG tablet Take 50 mg by mouth daily.   Yes [provider]  benzonatate (TESSALON) 100 MG capsule Take 1 capsule (100 mg total) by mouth every 8 (eight) hours. Patient not taking: Reported on 03/26/2017 07/25/16   Deborha Payment, PA-C  ciprofloxacin (CIPRO) 500 MG tablet Take 1 tablet (500 mg total) by mouth 2 (two) times daily. Patient not taking: Reported on 03/26/2017 02/08/15   Thressa Sheller D, CNM  gabapentin (NEURONTIN) 100  MG capsule Take 1 capsule (100 mg total) by mouth 2 (two) times daily. Patient not taking: Reported on 03/26/2017 06/18/16   Kerrin Champagne, MD  gabapentin (NEURONTIN) 300 MG capsule TAKE 1 CAPSULE BY MOUTH THREE TIMES A DAY Patient not taking: Reported on 07/28/2018 01/14/18   Kerrin Champagne, MD  ibuprofen (ADVIL,MOTRIN) 600 MG tablet Take 1 tablet (600 mg total) by mouth every 6 (six) hours as needed. Patient not taking: Reported on 03/26/2017 02/22/17   Audry Pili, PA-C  meloxicam (MOBIC) 15 MG tablet Take 1 tablet (15 mg total) by mouth daily. Patient not taking: Reported on 03/26/2017 06/18/16   Kerrin Champagne, MD  methocarbamol (ROBAXIN) 500 MG tablet Take 1 tablet (500 mg total) by mouth 2 (two) times daily. Patient not taking: Reported on 03/26/2017 02/22/17   Audry Pili, PA-C  traMADol (ULTRAM) 50 MG tablet Take 1 tablet (50 mg total) by mouth every 6 (six) hours as  needed. Patient not taking: Reported on 03/26/2017 06/18/16   Kerrin Champagne, MD    Family History Family History  Problem Relation Age of Onset  . Hypertension Mother   . Cancer Sister        Leukemia  . Alcohol abuse Brother     Social History Social History   Tobacco Use  . Smoking status: Never Smoker  . Smokeless tobacco: Never Used  Substance Use Topics  . Alcohol use: No  . Drug use: No     Allergies   Oxycodone   Review of Systems Review of Systems  Constitutional: Negative for activity change, appetite change, fatigue and fever.  HENT: Negative for congestion and rhinorrhea.   Respiratory: Negative for chest tightness and shortness of breath.   Cardiovascular: Negative for chest pain.  Gastrointestinal: Positive for abdominal pain and nausea. Negative for constipation, diarrhea and vomiting.  Genitourinary: Positive for dysuria, flank pain and urgency. Negative for vaginal bleeding and vaginal discharge.  Musculoskeletal: Positive for back pain. Negative for arthralgias and myalgias.  Neurological: Negative for dizziness, weakness and headaches.   all other systems are negative except as noted in the HPI and PMH.     Physical Exam Updated Vital Signs BP 140/89   Pulse (!) 59   Temp 98.1 F (36.7 C) (Oral)   Resp 18   Ht 5\' 8"  (1.727 m)   Wt 103 kg   SpO2 100%   BMI 34.52 kg/m   Physical Exam Vitals signs and nursing note reviewed.  Constitutional:      General: She is not in acute distress.    Appearance: She is well-developed.  HENT:     Head: Normocephalic and atraumatic.     Mouth/Throat:     Pharynx: No oropharyngeal exudate.  Eyes:     Conjunctiva/sclera: Conjunctivae normal.     Pupils: Pupils are equal, round, and reactive to light.  Neck:     Musculoskeletal: Normal range of motion and neck supple.     Comments: No meningismus. Cardiovascular:     Rate and Rhythm: Normal rate and regular rhythm.     Heart sounds: Normal heart  sounds. No murmur.  Pulmonary:     Effort: Pulmonary effort is normal. No respiratory distress.     Breath sounds: Normal breath sounds.  Abdominal:     Palpations: Abdomen is soft.     Tenderness: There is no abdominal tenderness. There is no guarding or rebound.     Comments: Left upper quadrant tenderness, no guarding or  rebound  Musculoskeletal: Normal range of motion.        General: Tenderness present.     Comments: Left paraspinal lumbar tenderness  Skin:    General: Skin is warm.  Neurological:     Mental Status: She is alert and oriented to person, place, and time.     Cranial Nerves: No cranial nerve deficit.     Motor: No abnormal muscle tone.     Coordination: Coordination normal.     Comments: No ataxia on finger to nose bilaterally. No pronator drift. 5/5 strength throughout. CN 2-12 intact.Equal grip strength. Sensation intact.   Psychiatric:        Behavior: Behavior normal.      ED Treatments / Results  Labs (all labs ordered are listed, but only abnormal results are displayed) Labs Reviewed  CBC - Abnormal; Notable for the following components:      Result Value   WBC 12.9 (*)    Hemoglobin 11.7 (*)    All other components within normal limits  BASIC METABOLIC PANEL - Abnormal; Notable for the following components:   Glucose, Bld 115 (*)    All other components within normal limits  URINALYSIS, ROUTINE W REFLEX MICROSCOPIC  D-DIMER, QUANTITATIVE (NOT AT [facility])  POC URINE PREG, ED    EKG None  Radiology [place] Transvaginal Non-ob  Result Date: 07/28/2018 CLINICAL DATA:  Three day history of left flank pain. EXAM: TRANSABDOMINAL AND TRANSVAGINAL ULTRASOUND OF PELVIS DOPPLER ULTRASOUND OF OVARIES TECHNIQUE: Both transabdominal and transvaginal ultrasound examinations of the pelvis were performed. Transabdominal technique was performed for global imaging of the pelvis including uterus, ovaries, adnexal regions, and pelvic cul-de-sac. It was necessary to  proceed with endovaginal exam following the transabdominal exam to visualize the left ovary and left adnexal regions. Color and duplex Doppler ultrasound was utilized to evaluate blood flow to the ovaries. COMPARISON:  Abdominal and pelvic CT scan of February 22, 2017 FINDINGS: Uterus Measurements: 6.9 x 4.1 x 5.2 cm = volume: 78 mL. There is an IUD present. There is a left-sided anterior fundal fibroid measuring 1 x 0.9 x 0.8 cm. Endometrium Thickness: 2.4 mm.  There is fluid in the endometrial cavity. Right ovary Measurements: 3.7 x 1.9 x 2.2 cm = volume: 8.2 mL. There is a simple appearing right ovarian cyst measuring 1.9 x 1.6 x 1.6 cm. Left ovary Measurements: 4.0 x 2.5 x 2.7 cm = volume: 14.1 mL. There is a simple appearing left ovarian cyst measuring 2.5 x 2.3 x 2.6 cm. Pulsed Doppler evaluation of both ovaries demonstrates normal low-resistance arterial and venous waveforms. Other findings No abnormal free fluid.  No suspicious adnexal masses are observed. IMPRESSION: Normal-sized uterus. Small left-sided fundal fibroid measuring up to 1 cm in diameter. There is an IUD. No endometrial stripe thickening. A small amount of endometrial fluid is present. Simple appearing cysts in both ovaries. No findings to suggest ovarian torsion or tubo-ovarian abscess. Electronically Signed   By: David  [place] M.D.   On: 07/28/2018 07:39   [place] Pelvis Complete  Result Date: 07/28/2018 CLINICAL DATA:  Three day history of left flank pain. EXAM: TRANSABDOMINAL AND TRANSVAGINAL ULTRASOUND OF PELVIS DOPPLER ULTRASOUND OF OVARIES TECHNIQUE: Both transabdominal and transvaginal ultrasound examinations of the pelvis were performed. Transabdominal technique was performed for global imaging of the pelvis including uterus, ovaries, adnexal regions, and pelvic cul-de-sac. It was necessary to proceed with endovaginal exam following the transabdominal exam to visualize the left ovary and left adnexal regions. Color and  duplex Doppler  ultrasound was utilized to evaluate blood flow to the ovaries. COMPARISON:  Abdominal and pelvic CT scan of February 22, 2017 FINDINGS: Uterus Measurements: 6.9 x 4.1 x 5.2 cm = volume: 78 mL. There is an IUD present. There is a left-sided anterior fundal fibroid measuring 1 x 0.9 x 0.8 cm. Endometrium Thickness: 2.4 mm.  There is fluid in the endometrial cavity. Right ovary Measurements: 3.7 x 1.9 x 2.2 cm = volume: 8.2 mL. There is a simple appearing right ovarian cyst measuring 1.9 x 1.6 x 1.6 cm. Left ovary Measurements: 4.0 x 2.5 x 2.7 cm = volume: 14.1 mL. There is a simple appearing left ovarian cyst measuring 2.5 x 2.3 x 2.6 cm. Pulsed Doppler evaluation of both ovaries demonstrates normal low-resistance arterial and venous waveforms. Other findings No abnormal free fluid.  No suspicious adnexal masses are observed. IMPRESSION: Normal-sized uterus. Small left-sided fundal fibroid measuring up to 1 cm in diameter. There is an IUD. No endometrial stripe thickening. A small amount of endometrial fluid is present. Simple appearing cysts in both ovaries. No findings to suggest ovarian torsion or tubo-ovarian abscess. Electronically Signed   By: David  [place] M.D.   On: 07/28/2018 07:39   US Renal  Result Date: 07/28/2018 CLINICAL DATA:  Three day history of left flank pain. History of kidney stones with surgery for same. EXAM: RENAL / URINARY TRACT ULTRASOUND COMPLETE COMPARISON:  Abdominal and pelvic CT scan of February 22, 2017 FINDINGS: Right Kidney: Renal measurements: 10.6 x 4.6 x 4.8 cm = volume: 122 mL . Echogenicity within normal limits. No mass or hydronephrosis visualized. Left Kidney: Renal measurements: 11.4 x 4.9 x 4.7 cm = volume: 135 mL. Echogenicity within normal limits. No mass or hydronephrosis visualized. Bladder: Appears normal for degree of bladder distention. IMPRESSION: Normal renal ultrasound examination.  No hydronephrosis nor stones. Electronically Signed   By: David  [place] M.D.   On:  07/28/2018 07:35   [place] Art/ven Flow Abd Pelv Doppler  Result Date: 07/28/2018 CLINICAL DATA:  Three day history of left flank pain. EXAM: TRANSABDOMINAL AND TRANSVAGINAL ULTRASOUND OF PELVIS DOPPLER ULTRASOUND OF OVARIES TECHNIQUE: Both transabdominal and transvaginal ultrasound examinations of the pelvis were performed. Transabdominal technique was performed for global imaging of the pelvis including uterus, ovaries, adnexal regions, and pelvic cul-de-sac. It was necessary to proceed with endovaginal exam following the transabdominal exam to visualize the left ovary and left adnexal regions. Color and duplex Doppler ultrasound was utilized to evaluate blood flow to the ovaries. COMPARISON:  Abdominal and pelvic CT scan of February 22, 2017 FINDINGS: Uterus Measurements: 6.9 x 4.1 x 5.2 cm = volume: 78 mL. There is an IUD present. There is a left-sided anterior fundal fibroid measuring 1 x 0.9 x 0.8 cm. Endometrium Thickness: 2.4 mm.  There is fluid in the endometrial cavity. Right ovary Measurements: 3.7 x 1.9 x 2.2 cm = volume: 8.2 mL. There is a simple appearing right ovarian cyst measuring 1.9 x 1.6 x 1.6 cm. Left ovary Measurements: 4.0 x 2.5 x 2.7 cm = volume: 14.1 mL. There is a simple appearing left ovarian cyst measuring 2.5 x 2.3 x 2.6 cm. Pulsed Doppler evaluation of both ovaries demonstrates normal low-resistance arterial and venous waveforms. Other findings No abnormal free fluid.  No suspicious adnexal masses are observed. IMPRESSION: Normal-sized uterus. Small left-sided fundal fibroid measuring up to 1 cm in diameter. There is an IUD. No endometrial stripe thickening. A small amount of endometrial fluid is present. Simple  appearing cysts in both ovaries. No findings to suggest ovarian torsion or tubo-ovarian abscess. Electronically Signed   By: David  [place] M.D.   On: 07/28/2018 07:39    Procedures Procedures (including critical care time)  Medications Ordered in ED Medications  ketorolac  (TORADOL) 30 MG/ML injection 30 mg (30 mg Intramuscular Given 07/28/18 0532)  ondansetron (ZOFRAN-ODT) disintegrating tablet 4 mg (4 mg Oral Given 07/28/18 0532)     Initial Impression / Assessment and Plan / ED Course  I have reviewed the triage vital signs and the nursing notes.  Pertinent labs & imaging results that were available during my care of the patient were reviewed by me and considered in my medical decision making (see chart for details).    Left upper quadrant pain with nausea similar to previous kidney stones.  Patient appears comfortable.  Labs are reassuring.  No fever.  hCG is negative, urinalysis is negative Last CT scan in July 2018 did not show any acute pathology  Labs show leukocytosis.  Patient is very comfortable.  She denies any chest pain or shortness of breath.  Ultrasound of her kidneys shows no hydronephrosis or kidney stones. It is possible she may have passed a kidney stone.  Pelvic US shows no evidence of torsion or acute ovarian pathology.  IUD in place.   Patient will be treated supportively with NSAIDs and antiemetics.  Return precautions discussed/.     Final Clinical Impressions(s) / ED Diagnoses   Final diagnoses:  Left upper quadrant pain    ED Discharge Orders    None       Musab Wingard, Jeannett Senior, MD 07/28/18 [phone]

## 2018-12-14 ENCOUNTER — Encounter (HOSPITAL_COMMUNITY): Payer: Self-pay | Admitting: Emergency Medicine

## 2018-12-14 ENCOUNTER — Emergency Department (HOSPITAL_COMMUNITY)
Admission: EM | Admit: 2018-12-14 | Discharge: 2018-12-14 | Disposition: A | Discharge status: Home / Self Care | Payer: BLUE CROSS/BLUE SHIELD | Attending: Emergency Medicine | Admitting: Emergency Medicine

## 2018-12-14 ENCOUNTER — Emergency Department (HOSPITAL_COMMUNITY): Payer: BLUE CROSS/BLUE SHIELD

## 2018-12-14 ENCOUNTER — Other Ambulatory Visit: Payer: Self-pay

## 2018-12-14 DIAGNOSIS — R51 Headache: Secondary | ICD-10-CM | POA: Diagnosis not present

## 2018-12-14 DIAGNOSIS — R0602 Shortness of breath: Secondary | ICD-10-CM | POA: Insufficient documentation

## 2018-12-14 DIAGNOSIS — R6889 Other general symptoms and signs: Secondary | ICD-10-CM

## 2018-12-14 DIAGNOSIS — J029 Acute pharyngitis, unspecified: Secondary | ICD-10-CM | POA: Diagnosis not present

## 2018-12-14 DIAGNOSIS — R05 Cough: Secondary | ICD-10-CM | POA: Diagnosis present

## 2018-12-14 DIAGNOSIS — J45909 Unspecified asthma, uncomplicated: Secondary | ICD-10-CM | POA: Insufficient documentation

## 2018-12-14 DIAGNOSIS — F329 Major depressive disorder, single episode, unspecified: Secondary | ICD-10-CM | POA: Insufficient documentation

## 2018-12-14 DIAGNOSIS — I10 Essential (primary) hypertension: Secondary | ICD-10-CM | POA: Diagnosis not present

## 2018-12-14 DIAGNOSIS — Z9049 Acquired absence of other specified parts of digestive tract: Secondary | ICD-10-CM | POA: Insufficient documentation

## 2018-12-14 DIAGNOSIS — Z20822 Contact with and (suspected) exposure to covid-19: Secondary | ICD-10-CM

## 2018-12-14 DIAGNOSIS — R0982 Postnasal drip: Secondary | ICD-10-CM | POA: Insufficient documentation

## 2018-12-14 LAB — INFLUENZA PANEL BY PCR (TYPE A & B)
Influenza A By PCR: NEGATIVE
Influenza B By PCR: NEGATIVE

## 2018-12-14 LAB — GROUP A STREP BY PCR: Group A Strep by PCR: NOT DETECTED

## 2018-12-14 MED ORDER — DEXAMETHASONE 4 MG PO TABS
10.0000 mg | ORAL_TABLET | Freq: Once | ORAL | Status: AC
  Administered 2018-12-14: 04:00:00 10 mg via ORAL
  Filled 2018-12-14: qty 3

## 2018-12-14 MED ORDER — LIDOCAINE VISCOUS HCL 2 % MT SOLN
15.0000 mL | Freq: Once | OROMUCOSAL | Status: AC
  Administered 2018-12-14: 04:00:00 15 mL via OROMUCOSAL
  Filled 2018-12-14: qty 15

## 2018-12-14 NOTE — ED Notes (Signed)
Patient verbalizes understanding of discharge instructions. Opportunity for questioning and answers were provided. Armband removed by staff, pt discharged from ED.  

## 2018-12-14 NOTE — ED Triage Notes (Addendum)
Pt to ED with c/o generalized body aches, headache and fatigue x's 3 days.  Pt denies any fever.  Pt st's she works at [employer] where several people have tested positive for [employer]

## 2018-12-14 NOTE — ED Provider Notes (Signed)
Emergency Department Provider Note   I have reviewed the triage vital signs and the nursing notes.   HISTORY  Chief Complaint Generalized Body Aches   HPI Nicole [name] Strassner is a 38 y.o. female with multiple medical problems documented below who presents the emergency department today with flulike illness.  Patient states she started with a sore throat a few days ago that progressively they worsened into having some cough, shortness of breath, raspy voice and headache.  Her headache seems to be in the frontal sinus area and around her nose.  She has had some nasal drip and thought it might be allergies so she tried her albuterol which did not seem to help.  She takes some ibuprofen which does seem to help her symptoms to a certain extent.  She works at [employer] for where multiple people are diagnosed with COVID, however she has not been tested.  She has felt hot but has not checked her temperature once and it was normal.  Did not get a flu shot this year.   No other associated or modifying symptoms.    Past Medical History:  Diagnosis Date  . Abdominal pain   . Anemia   . Asthma   . Blues 04/05/09  . Depression 04/13/2009  . GERD (gastroesophageal reflux disease)   . H/O endometritis 03/20/09  . H/O migraine   . History of kidney stones      x 3  . Hypertension   . Kidney stone   . Kidney stones   . Pregnancy induced hypertension 03/10/2009  . Trichomonas 2005  . Wears glasses     Patient Active Problem List   Diagnosis Date Noted  . NEUROPATHY-PERIPHERAL 11/06/2006  . ALLERGIC RHINITIS 11/06/2006  . NEPHROLITHIASIS 11/06/2006  . ABNORMAL BLOOD CHEMISTRY NEC 11/06/2006    Past Surgical History:  Procedure Laterality Date  . CHOLECYSTECTOMY  05/04/2012   Procedure: LAPAROSCOPIC CHOLECYSTECTOMY WITH INTRAOPERATIVE CHOLANGIOGRAM;  Surgeon: Romie LeveeAlicia Thomas, MD;  Location: [facility];  Service: General;  Laterality: N/A;  Laparoscopic cholecysectomy with intraoperative  cholangiogram  . KIDNEY STONE SURGERY  2005 & 2007  . LITHOTRIPSY    . surgery on left leg     2009  . THERAPEUTIC ABORTION  2000  . WISDOM TOOTH EXTRACTION  2005 & 2007    Current Outpatient Rx  . Order #: [id number] Class: Historical Med  . Order #: [id number] Class: Normal  . Order #: [id number] Class: Historical Med  . Order #: [id number] Class: Normal    Allergies Oxycodone  Family History  Problem Relation Age of Onset  . Hypertension Mother   . Cancer Sister        Leukemia  . Alcohol abuse Brother     Social History Social History   Tobacco Use  . Smoking status: Never Smoker  . Smokeless tobacco: Never Used  Substance Use Topics  . Alcohol use: No  . Drug use: No    Review of Systems  All other systems negative except as documented in the HPI. All pertinent positives and negatives as reviewed in the HPI. ____________________________________________   PHYSICAL EXAM:  VITAL SIGNS: ED Triage Vitals  Enc Vitals Group     BP 12/14/18 0204 (!) 155/90     Pulse Rate 12/14/18 0204 85     Resp 12/14/18 0204 (!) 22     Temp 12/14/18 0204 97.6 F (36.4 C)     Temp Source 12/14/18 0204 Oral     SpO2 12/14/18 0204 100 %  Weight 12/14/18 0208 217 lb (98.4 kg)     Height 12/14/18 0208 5\' 3"  (1.6 m)    Constitutional: Alert and oriented. Well appearing and in no acute distress. Eyes: Conjunctivae are normal. PERRL. EOMI. Head: Atraumatic. Nose: No congestion/rhinnorhea. Mouth/Throat: Mucous membranes are moist.  Oropharynx non-erythematous. Neck: No stridor.  No meningeal signs.  Right anterior cervical lymphadenopathy Cardiovascular: Normal rate, regular rhythm. Good peripheral circulation. Grossly normal heart sounds.   Respiratory: Normal respiratory effort.  No retractions. Lungs CTAB. Gastrointestinal: Soft and nontender. No distention.  Musculoskeletal: No lower extremity tenderness nor edema. No gross deformities of extremities. Neurologic:  Normal  speech and language. No gross focal neurologic deficits are appreciated.  Skin:  Skin is warm, dry and intact. No rash noted.   ____________________________________________   LABS (all labs ordered are listed, but only abnormal results are displayed)  Labs Reviewed  GROUP A STREP BY PCR  INFLUENZA PANEL BY PCR (TYPE A & B)   ____________________________________________  EKG   EKG Interpretation  Date/Time:  Monday Dec 14 2018 03:37:16 EDT Ventricular Rate:  76 PR Interval:    QRS Duration: 84 QT Interval:  355 QTC Calculation: 400 R Axis:   12 Text Interpretation:  Sinus rhythm Consider right atrial enlargement Abnormal R-wave progression, early transition Left ventricular hypertrophy Borderline T abnormalities, diffuse leads TWI similar to previous Confirmed by Marily Memos [phone]) on 12/14/2018 4:26:13 AM       ____________________________________________  RADIOLOGY  Dg Chest Portable 1 View  Result Date: 12/14/2018 CLINICAL DATA:  Generalized body aches EXAM: PORTABLE CHEST 1 VIEW COMPARISON:  07/25/2016 FINDINGS: Heart and mediastinal contours are within normal limits. No focal opacities or effusions. No acute bony abnormality. IMPRESSION: No active disease. Electronically Signed   By: Charlett Nose M.D.   On: 12/14/2018 03:48    ____________________________________________   PROCEDURES  Procedure(s) performed:   Procedures   ____________________________________________   INITIAL IMPRESSION / ASSESSMENT AND PLAN / ED COURSE  Nicole [name] was evaluated in Emergency Department on 12/14/2018 for the symptoms described in the history of present illness. She was evaluated in the context of the global COVID-19 pandemic, which necessitated consideration that the patient might be at risk for infection with the SARS-CoV-2 virus that causes COVID-19. Institutional protocols and algorithms that pertain to the evaluation of patients at risk for COVID-19 are in a state of  rapid change based on information released by regulatory bodies including the CDC and federal and state organizations. These policies and algorithms were followed during the patient's care in the ED. vi   Symptoms certainly sound viral in nature but could also be strep.  We will check for those things and get a chest x-ray.  Her vital signs are within normal limits she is afebrile.  She is not any respiratory distress I doubt she will need to be admitted to the hospital.  Discussed with her that we do not test for coronavirus on outpatient basis at this time and recommended she get tested elsewhere if she still desires that but if her tests here were negative for flu, pneumonia then it was most likely coronavirus and quarantine information given.   Pertinent labs & imaging results that were available during my care of the patient were reviewed by me and considered in my medical decision making (see chart for details).  A medical screening exam was performed and I feel the patient has had an appropriate workup for their chief complaint at this time and  likelihood of emergent condition existing is low. They have been counseled on decision, discharge, follow up and which symptoms necessitate immediate return to the emergency department. They or their family verbally stated understanding and agreement with plan and discharged in stable condition.   ____________________________________________  FINAL CLINICAL IMPRESSION(S) / ED DIAGNOSES  Final diagnoses:  Suspected Covid-19 Virus Infection     MEDICATIONS GIVEN DURING THIS VISIT:  Medications  lidocaine (XYLOCAINE) 2 % viscous mouth solution 15 mL (15 mLs Mouth/Throat Given 12/14/18 0344)  dexamethasone (DECADRON) tablet 10 mg (10 mg Oral Given 12/14/18 0342)     NEW OUTPATIENT MEDICATIONS STARTED DURING THIS VISIT:  Current Discharge Medication List      Note:  This note was prepared with assistance of Dragon voice recognition software.  Occasional wrong-word or sound-a-like substitutions may have occurred due to the inherent limitations of voice recognition software.   Daphne Karrer, Barbara Cower, MD 12/14/18 [phone]

## 2021-03-25 ENCOUNTER — Encounter (HOSPITAL_COMMUNITY): Payer: Self-pay

## 2021-03-25 ENCOUNTER — Emergency Department (HOSPITAL_COMMUNITY): Payer: BC Managed Care – PPO

## 2021-03-25 ENCOUNTER — Emergency Department (HOSPITAL_COMMUNITY)
Admission: EM | Admit: 2021-03-25 | Discharge: 2021-03-25 | Disposition: A | Discharge status: Home / Self Care | Payer: BC Managed Care – PPO | Attending: Emergency Medicine | Admitting: Emergency Medicine

## 2021-03-25 DIAGNOSIS — Y9389 Activity, other specified: Secondary | ICD-10-CM | POA: Insufficient documentation

## 2021-03-25 DIAGNOSIS — S92521A Displaced fracture of medial phalanx of right lesser toe(s), initial encounter for closed fracture: Secondary | ICD-10-CM | POA: Diagnosis not present

## 2021-03-25 DIAGNOSIS — W500XXA Accidental hit or strike by another person, initial encounter: Secondary | ICD-10-CM | POA: Insufficient documentation

## 2021-03-25 DIAGNOSIS — Z79899 Other long term (current) drug therapy: Secondary | ICD-10-CM | POA: Insufficient documentation

## 2021-03-25 DIAGNOSIS — J45909 Unspecified asthma, uncomplicated: Secondary | ICD-10-CM | POA: Insufficient documentation

## 2021-03-25 DIAGNOSIS — I10 Essential (primary) hypertension: Secondary | ICD-10-CM | POA: Diagnosis not present

## 2021-03-25 DIAGNOSIS — S99921A Unspecified injury of right foot, initial encounter: Secondary | ICD-10-CM | POA: Diagnosis present

## 2021-03-25 MED ORDER — NAPROXEN 500 MG PO TABS: 500.0000 mg | ORAL_TABLET | Freq: Two times a day (BID) | ORAL | 0 refills | Status: DC
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
## 2021-03-25 NOTE — Discharge Instructions (Addendum)
I am prescribing you an anti-inflammatory medication called naproxen.  This is similar to Aleve.  You can take this up to 2 times a day for management of your pain.  Try to take it with a small amount of food to help prevent stomach irritation.  Below is the contact information for a local orthopedist.  Please give them a call next week and schedule an appointment for follow-up.  If you develop any new or worsening symptoms please come back to the emergency department.  It was a pleasure to meet you.

## 2021-04-06 ENCOUNTER — Encounter: Payer: Self-pay | Admitting: Orthopedic Surgery

## 2021-04-06 ENCOUNTER — Other Ambulatory Visit: Payer: Self-pay

## 2021-04-06 ENCOUNTER — Ambulatory Visit (INDEPENDENT_AMBULATORY_CARE_PROVIDER_SITE_OTHER): Payer: BC Managed Care – PPO | Admitting: Orthopedic Surgery

## 2021-04-06 DIAGNOSIS — S92521A Displaced fracture of medial phalanx of right lesser toe(s), initial encounter for closed fracture: Secondary | ICD-10-CM

## 2021-04-06 MED ORDER — ACETAMINOPHEN-CODEINE #3 300-30 MG PO TABS: 1.0000 | ORAL_TABLET | Freq: Two times a day (BID) | ORAL | 0 refills | Status: DC | PRN
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
## 2021-04-09 NOTE — Progress Notes (Signed)
   Procedure Note  Patient: Nicole [name]             Date of Birth: 04-17-1981           MRN: [id number]             Visit Date: 04/06/2021  Procedures: Visit Diagnoses:  1. Closed displaced fracture of middle phalanx of lesser toe of right foot, initial encounter     No procedures performed

## 2021-04-20 ENCOUNTER — Encounter: Payer: Self-pay | Admitting: Surgical

## 2021-04-20 ENCOUNTER — Telehealth: Payer: Self-pay | Admitting: Orthopedic Surgery

## 2021-04-20 ENCOUNTER — Ambulatory Visit (INDEPENDENT_AMBULATORY_CARE_PROVIDER_SITE_OTHER): Payer: BC Managed Care – PPO | Admitting: Surgical

## 2021-04-20 ENCOUNTER — Ambulatory Visit (INDEPENDENT_AMBULATORY_CARE_PROVIDER_SITE_OTHER): Payer: BC Managed Care – PPO

## 2021-04-20 ENCOUNTER — Other Ambulatory Visit: Payer: Self-pay

## 2021-04-20 DIAGNOSIS — S92521A Displaced fracture of medial phalanx of right lesser toe(s), initial encounter for closed fracture: Secondary | ICD-10-CM | POA: Diagnosis not present

## 2021-04-20 NOTE — Telephone Encounter (Signed)
Pt submitted medical release from, [employer] faxed short term disability papers to [employer], and pt paid $25.00 cash payment to [employer]. Accepted 04/20/21

## 2021-04-20 NOTE — Progress Notes (Signed)
Post-fracture Visit Note   Patient: Nicole [name]           Date of Birth: September 26, 1980           MRN: [id number] Visit Date: 04/20/2021 PCP: Fleet Contras, MD   Assessment & Plan:  Chief Complaint:  Chief Complaint  Patient presents with   Right Foot - Follow-up   Visit Diagnoses:  1. Closed displaced fracture of middle phalanx of lesser toe of right foot, initial encounter     Plan: Patient is a 40 year old female who presents for reevaluation of fracture of the middle phalanx of the second toe.  She has about 3 to 4 weeks out from her injury.  She notes continued aching throbbing pains that is worse with walking.  No recurrent fall or injury.  She is not taking any vitamin D.  She does have vitamin D at home that she will start to take 1000 or 2000 units daily.  She is ambulating with a postop shoe.  Taking occasional Tylenol with codeine.  Radiographs of the right foot show fracture of the proximal aspect of the middle phalanx of the second toe with extension into the joint.  There is increased fracture consolidation with early callus formation noted compared with prior radiographs.  Plan for her to continue with postop shoe for the next 2 weeks.  Her job involves a lot of standing and walking and does not allow her to work without closed toed shoes.  Do not think that she would be okay to go back to work without some support from a hard soled shoe which she does not have except for the postop shoe.  She will discontinue the postop shoe in 2 weeks and return to her regular shoe as well as returning to work at that time.  Follow-up in 3 weeks for clinical recheck with radiographs.  If she has any concerns in the meantime, she will call the office.  Follow-Up Instructions: No follow-ups on file.   Orders:  Orders Placed This Encounter  Procedures   XR Foot Complete Right   No orders of the defined types were placed in this encounter.   Imaging: No results found.  PMFS  History: Patient Active Problem List   Diagnosis Date Noted   NEUROPATHY-PERIPHERAL 11/06/2006   ALLERGIC RHINITIS 11/06/2006   NEPHROLITHIASIS 11/06/2006   ABNORMAL BLOOD CHEMISTRY NEC 11/06/2006   Past Medical History:  Diagnosis Date   Abdominal pain    Anemia    Asthma    Blues 04/05/09   Depression 04/13/2009   GERD (gastroesophageal reflux disease)    H/O endometritis 03/20/09   H/O migraine    History of kidney stones      x 3   Hypertension    Kidney stone    Kidney stones    Pregnancy induced hypertension 03/10/2009   Trichomonas 2005   Wears glasses     Family History  Problem Relation Age of Onset   Hypertension Mother    Cancer Sister        Leukemia   Alcohol abuse Brother     Past Surgical History:  Procedure Laterality Date   CHOLECYSTECTOMY  05/04/2012   Procedure: LAPAROSCOPIC CHOLECYSTECTOMY WITH INTRAOPERATIVE CHOLANGIOGRAM;  Surgeon: Romie Levee, MD;  Location: [facility];  Service: General;  Laterality: N/A;  Laparoscopic cholecysectomy with intraoperative cholangiogram   KIDNEY STONE SURGERY  2005 & 2007   LITHOTRIPSY     surgery on left leg  2009   THERAPEUTIC ABORTION  2000   WISDOM TOOTH EXTRACTION  2005 & 2007   Social History   Occupational History   Not on file  Tobacco Use   Smoking status: Never   Smokeless tobacco: Never  Substance and Sexual Activity   Alcohol use: No   Drug use: No   Sexual activity: Yes    Birth control/protection: I.U.D.    Comment: mirena

## 2021-05-11 ENCOUNTER — Other Ambulatory Visit: Payer: Self-pay | Admitting: Surgical

## 2021-05-11 ENCOUNTER — Ambulatory Visit: Payer: BC Managed Care – PPO | Admitting: Surgical

## 2021-05-11 ENCOUNTER — Telehealth: Payer: Self-pay | Admitting: Orthopedic Surgery

## 2021-05-11 MED ORDER — ACETAMINOPHEN-CODEINE #3 300-30 MG PO TABS: 1.0000 | ORAL_TABLET | Freq: Every day | ORAL | 0 refills | Status: AC | PRN

## 2021-05-11 NOTE — Telephone Encounter (Signed)
Patient states her foot/toe has swollen back up since she has stop wearing the boot and has returned back to work.     Rx refill Tylenol #3 [facility] on [address]

## 2021-05-11 NOTE — Telephone Encounter (Signed)
Missed her appointment today. I will refill this one time but needs to take OTC medications after this

## 2021-05-11 NOTE — Telephone Encounter (Signed)
Tried calling patient. LM to [facility] to discuss.

## 2021-05-23 ENCOUNTER — Ambulatory Visit (INDEPENDENT_AMBULATORY_CARE_PROVIDER_SITE_OTHER): Payer: BC Managed Care – PPO | Admitting: Orthopedic Surgery

## 2021-05-23 ENCOUNTER — Other Ambulatory Visit: Payer: Self-pay

## 2021-05-23 ENCOUNTER — Encounter: Payer: Self-pay | Admitting: Orthopedic Surgery

## 2021-05-23 ENCOUNTER — Ambulatory Visit: Payer: Self-pay

## 2021-05-23 DIAGNOSIS — S92521A Displaced fracture of medial phalanx of right lesser toe(s), initial encounter for closed fracture: Secondary | ICD-10-CM

## 2021-05-23 NOTE — Progress Notes (Signed)
   Post-Op Visit Note   Patient: NIYANA CHESBRO           Date of Birth: 1981/03/05           MRN: [id number] Visit Date: 05/23/2021 PCP: Fleet Contras, MD   Assessment & Plan:  Chief Complaint:  Chief Complaint  Patient presents with   Right Foot - Pain, Fracture, Follow-up   Visit Diagnoses:  1. Closed displaced fracture of middle phalanx of lesser toe of right foot, initial encounter     Plan: Patient presents for follow-up of right foot second toe phalanx fracture.  She has returned to work.  On exam she has about one third of the motion at the PIP joint on the right compared to the left.  Some swelling is present.  No discrete fracture motion is present with palpation.  Impression is healed phalanx fracture which is intra-articular and likely going on to use a mild degree of autofusion.  Kercher to keep as much motion as possible in that joint of the toe.  Complete pain relief unlikely in this event with intra-articular fracture though should continue to improve through the rest of the year.  Follow-up with [place] as needed.  If symptoms worsen to the point where she needs intervention to fusion would really be the only option versus PIP excision.  Follow-Up Instructions: Return if symptoms worsen or fail to improve.   Orders:  Orders Placed This Encounter  Procedures   XR Foot Complete Right   No orders of the defined types were placed in this encounter.   Imaging: No results found.  PMFS History: Patient Active Problem List   Diagnosis Date Noted   NEUROPATHY-PERIPHERAL 11/06/2006   ALLERGIC RHINITIS 11/06/2006   NEPHROLITHIASIS 11/06/2006   ABNORMAL BLOOD CHEMISTRY NEC 11/06/2006   Past Medical History:  Diagnosis Date   Abdominal pain    Anemia    Asthma    Blues 04/05/09   Depression 04/13/2009   GERD (gastroesophageal reflux disease)    H/O endometritis 03/20/09   H/O migraine    History of kidney stones      x 3   Hypertension    Kidney stone    Kidney  stones    Pregnancy induced hypertension 03/10/2009   Trichomonas 2005   Wears glasses     Family History  Problem Relation Age of Onset   Hypertension Mother    Cancer Sister        Leukemia   Alcohol abuse Brother     Past Surgical History:  Procedure Laterality Date   CHOLECYSTECTOMY  05/04/2012   Procedure: LAPAROSCOPIC CHOLECYSTECTOMY WITH INTRAOPERATIVE CHOLANGIOGRAM;  Surgeon: Romie Levee, MD;  Location: [facility];  Service: General;  Laterality: N/A;  Laparoscopic cholecysectomy with intraoperative cholangiogram   KIDNEY STONE SURGERY  2005 & 2007   LITHOTRIPSY     surgery on left leg     2009   THERAPEUTIC ABORTION  2000   WISDOM TOOTH EXTRACTION  2005 & 2007   Social History   Occupational History   Not on file  Tobacco Use   Smoking status: Never   Smokeless tobacco: Never  Substance and Sexual Activity   Alcohol use: No   Drug use: No   Sexual activity: Yes    Birth control/protection: I.U.D.    Comment: mirena

## 2022-08-02 ENCOUNTER — Ambulatory Visit (HOSPITAL_COMMUNITY)
Admission: EM | Admit: 2022-08-02 | Discharge: 2022-08-02 | Disposition: A | Discharge status: Home / Self Care | Payer: BC Managed Care – PPO | Attending: Internal Medicine | Admitting: Internal Medicine

## 2022-08-02 ENCOUNTER — Encounter (HOSPITAL_COMMUNITY): Payer: Self-pay

## 2022-08-02 DIAGNOSIS — R590 Localized enlarged lymph nodes: Secondary | ICD-10-CM | POA: Insufficient documentation

## 2022-08-02 DIAGNOSIS — R519 Headache, unspecified: Secondary | ICD-10-CM | POA: Insufficient documentation

## 2022-08-02 LAB — CBC WITH DIFFERENTIAL/PLATELET
Abs Immature Granulocytes: 0.08 10*3/uL — ABNORMAL HIGH (ref 0.00–0.07)
Basophils Absolute: 0 10*3/uL (ref 0.0–0.1)
Basophils Relative: 0 %
Eosinophils Absolute: 0.1 10*3/uL (ref 0.0–0.5)
Eosinophils Relative: 1 %
HCT: 38.8 % (ref 36.0–46.0)
Hemoglobin: 12.6 g/dL (ref 12.0–15.0)
Immature Granulocytes: 1 %
Lymphocytes Relative: 25 %
Lymphs Abs: 2.4 10*3/uL (ref 0.7–4.0)
MCH: 26.9 pg (ref 26.0–34.0)
MCHC: 32.5 g/dL (ref 30.0–36.0)
MCV: 82.9 fL (ref 80.0–100.0)
Monocytes Absolute: 0.7 10*3/uL (ref 0.1–1.0)
Monocytes Relative: 7 %
Neutro Abs: 6.1 10*3/uL (ref 1.7–7.7)
Neutrophils Relative %: 66 %
Platelets: 245 10*3/uL (ref 150–400)
RBC: 4.68 MIL/uL (ref 3.87–5.11)
RDW: 14.9 % (ref 11.5–15.5)
WBC: 9.5 10*3/uL (ref 4.0–10.5)
nRBC: 0 % (ref 0.0–0.2)

## 2022-08-02 LAB — BASIC METABOLIC PANEL
Anion gap: 7 (ref 5–15)
BUN: 11 mg/dL (ref 6–20)
CO2: 28 mmol/L (ref 22–32)
Calcium: 9.4 mg/dL (ref 8.9–10.3)
Chloride: 105 mmol/L (ref 98–111)
Creatinine, Ser: 0.54 mg/dL (ref 0.44–1.00)
GFR, Estimated: >60 mL/min (ref >60)
Glucose, Bld: 98 mg/dL (ref 70–99)
Potassium: 3.9 mmol/L (ref 3.5–5.1)
Sodium: 140 mmol/L (ref 135–145)

## 2022-08-02 NOTE — Discharge Instructions (Signed)
You have swollen lymph nodes of your neck. We have drawn some blood work today and we will call you if any of the results are abnormal. Follow-up with your primary care provider next week.   Apply warm compresses to the areas of swelling and inflammation.  He may keep taking ibuprofen 600 mg every 6 hours as needed for pain and inflammation to the area.  He may also take Tylenol 1000 mg every 6 hours as needed for pain.  If you develop any new or worsening symptoms over the next few days, please return to urgent care.  If your symptoms are severe, please go to the nearest ER for further evaluation.  I hope you feel better!

## 2022-08-02 NOTE — ED Triage Notes (Signed)
Pt states pain to head and neck has a lump on each side of her neck  and the top of her head states it has been for over a month.

## 2022-08-02 NOTE — ED Provider Notes (Signed)
[facility]    CSN: [id number] Arrival date & time: 08/02/22  1135      History   Chief Complaint Chief Complaint  Patient presents with   head and neck pain    HPI Nicole [name] is a 41 y.o. female.   Patient presents to urgent care for evaluation of swollen area to the base of the neck that started 2 months ago and bilateral swollen "bumps" to the base of the head at the neck. She also has one "bump" to the top of the head. States the nodules feel "tight" and painful when she presses on them. Denies recent fever, chills, URI symptoms, ear pain, sore throat, decreased oral intake, weight loss without trying, fatigue, dizziness, shortness of breath, chest pain, night sweats, confusion, or vision changes. She is not a diabetic and denies recent polyuria, polydipsia, or hematuria. States she has experienced these types of "bumps" to the buttock area in the past but these have gone away in a few days.  Patient was supposed to have an appointment with her primary care provider this morning but when she showed up, they told her that they did not have any appointment for her today and advised her to come to urgent care to be seen.  She did reschedule the appointment to be able to be seen next week and plans to discuss this with them at that time.  Denies history of malignancy, recent antibiotic/steroid use, vision changes, dizziness, acute blood loss, blood thinner use, and joint pain.  History of migraine headaches, states this does not feel similar.     Past Medical History:  Diagnosis Date   Abdominal pain    Anemia    Asthma    Blues 04/05/09   Depression 04/13/2009   GERD (gastroesophageal reflux disease)    H/O endometritis 03/20/09   H/O migraine    History of kidney stones      x 3   Hypertension    Kidney stone    Kidney stones    Pregnancy induced hypertension 03/10/2009   Trichomonas 2005   Wears glasses     Patient Active Problem List   Diagnosis Date Noted    NEUROPATHY-PERIPHERAL 11/06/2006   ALLERGIC RHINITIS 11/06/2006   NEPHROLITHIASIS 11/06/2006   ABNORMAL BLOOD CHEMISTRY NEC 11/06/2006    Past Surgical History:  Procedure Laterality Date   CHOLECYSTECTOMY  05/04/2012   Procedure: LAPAROSCOPIC CHOLECYSTECTOMY WITH INTRAOPERATIVE CHOLANGIOGRAM;  Surgeon: Romie Levee, MD;  Location: [facility];  Service: General;  Laterality: N/A;  Laparoscopic cholecysectomy with intraoperative cholangiogram   KIDNEY STONE SURGERY  2005 & 2007   LITHOTRIPSY     surgery on left leg     2009   THERAPEUTIC ABORTION  2000   WISDOM TOOTH EXTRACTION  2005 & 2007    OB History     Gravida  3   Para  2   Term  2   Preterm      AB  1   Living  2      SAB      IAB  1   Ectopic      Multiple      Live Births  2            Home Medications    Prior to Admission medications   Medication Sig Start Date End Date Taking? Authorizing Provider  acetaminophen-codeine (TYLENOL #3) 300-30 MG tablet Take 1 tablet by mouth daily as needed for moderate  pain. 05/11/21   Magnant, Charles L, PA-C  cetirizine (ZYRTEC) 10 MG tablet Take 10 mg by mouth daily.    [provider]  ibuprofen (ADVIL,MOTRIN) 600 MG tablet Take 1 tablet (600 mg total) by mouth every 6 (six) hours as needed. 07/28/18   Rancour, Jeannett Senior, MD  losartan (COZAAR) 50 MG tablet Take 50 mg by mouth daily.    [provider]  naproxen (NAPROSYN) 500 MG tablet Take 1 tablet (500 mg total) by mouth 2 (two) times daily with a meal. 03/25/21   Placido Sou, PA-C  ondansetron (ZOFRAN ODT) 4 MG disintegrating tablet Take 1 tablet (4 mg total) by mouth every 8 (eight) hours as needed for nausea or vomiting. 07/28/18   Rancour, Jeannett Senior, MD    Family History Family History  Problem Relation Age of Onset   Hypertension Mother    Cancer Sister        Leukemia   Alcohol abuse Brother     Social History Social History   Tobacco Use   Smoking status:  Never   Smokeless tobacco: Never  Substance Use Topics   Alcohol use: No   Drug use: No     Allergies   Oxycodone   Review of Systems Review of Systems Per HPI  Physical Exam Triage Vital Signs ED Triage Vitals  Enc Vitals Group     BP 08/02/22 1254 (!) 166/88     Pulse Rate 08/02/22 1254 80     Resp 08/02/22 1254 16     Temp 08/02/22 1254 97.6 F (36.4 C)     Temp Source 08/02/22 1254 Oral     SpO2 08/02/22 1254 97 %     Weight --      Height --      Head Circumference --      Peak Flow --      Pain Score 08/02/22 1255 8     Pain Loc --      Pain Edu? --      Excl. in GC? --    No data found.  Updated Vital Signs BP (!) 166/88 (BP Location: Right Arm)   Pulse 80   Temp 97.6 F (36.4 C) (Oral)   Resp 16   LMP 02/14/2022 (Approximate)   SpO2 97%   Visual Acuity Right Eye Distance:   Left Eye Distance:   Bilateral Distance:    Right Eye Near:   Left Eye Near:    Bilateral Near:     Physical Exam Vitals and nursing note reviewed.  Constitutional:      Appearance: She is not ill-appearing or toxic-appearing.  HENT:     Head: Normocephalic and atraumatic.     Right Ear: Hearing, tympanic membrane, ear canal and external ear normal.     Left Ear: Hearing, tympanic membrane, ear canal and external ear normal.     Nose: Nose normal.     Mouth/Throat:     Lips: Pink.  Eyes:     General: Lids are normal. Vision grossly intact. Gaze aligned appropriately.     Extraocular Movements: Extraocular movements intact.     Conjunctiva/sclera: Conjunctivae normal.  Cardiovascular:     Rate and Rhythm: Normal rate and regular rhythm.     Heart sounds: Normal heart sounds, S1 normal and S2 normal.  Pulmonary:     Effort: Pulmonary effort is normal. No respiratory distress.     Breath sounds: Normal breath sounds and air entry.  Musculoskeletal:     Cervical  back: Neck supple.  Lymphadenopathy:     Head:     Right side of head: Occipital adenopathy present.  No submental, submandibular, tonsillar, preauricular or posterior auricular adenopathy.     Left side of head: Occipital adenopathy present. No submental, submandibular, tonsillar, preauricular or posterior auricular adenopathy.     Cervical: No cervical adenopathy.     Comments: Singular swollen lymph nodes that are approximately the size of a pea appreciated to exam of the occipital region bilaterally.  Lymph nodes are firm, tender, and movable/nonfixed.  Skin:    General: Skin is warm and dry.     Capillary Refill: Capillary refill takes less than 2 seconds.     Findings: No rash.  Neurological:     General: No focal deficit present.     Mental Status: She is alert and oriented to person, place, and time. Mental status is at baseline.     Cranial Nerves: No dysarthria or facial asymmetry.  Psychiatric:        Mood and Affect: Mood normal.        Speech: Speech normal.        Behavior: Behavior normal.        Thought Content: Thought content normal.        Judgment: Judgment normal.      UC Treatments / Results  Labs (all labs ordered are listed, but only abnormal results are displayed)  EKG   Radiology No results found.  Procedures Procedures (including critical care time)  Medications Ordered in UC Medications - No data to display  Initial Impression / Assessment and Plan / UC Course  I have reviewed the triage vital signs and the nursing notes.  Pertinent labs & imaging results that were available during my care of the patient were reviewed by me and considered in my medical decision making (see chart for details).   1.  Occipital lymphadenopathy, bad headache CBC and BMP drawn to assess lab values potential infection causing lymph node swelling.  I cannot identify any sign of infection to physical exam that would be causing bilateral occipital lymph node swelling.  There are nodules to the scalp that are firm and likely adipose tissue/lipoma in etiology.  Neurologic  exam is without focal finding.  There is no indication for immediate referral to the emergency room at this time as vitals are stable and symptoms have been present for 1 to 2 months.  Recommend follow-up with PCP next week at appointment as patient would likely benefit from further workup of lymphadenopathy.  We will call her if any of her blood work comes back abnormal.  She is agreeable with this plan and has been given strict ER return precautions.   Discussed physical exam and available lab work findings in clinic with patient.  Counseled patient regarding appropriate use of medications and potential side effects for all medications recommended or prescribed today. Discussed red flag signs and symptoms of worsening condition,when to call the PCP office, return to urgent care, and when to seek higher level of care in the emergency department. Patient verbalizes understanding and agreement with plan. All questions answered. Patient discharged in stable condition.   Final Clinical Impressions(s) / UC Diagnoses   Final diagnoses:  Occipital lymphadenopathy  Bad headache     Discharge Instructions      You have swollen lymph nodes of your neck. We have drawn some blood work today and we will call you if any of the results are abnormal. Follow-up  with your primary care provider next week.   Apply warm compresses to the areas of swelling and inflammation.  He may keep taking ibuprofen 600 mg every 6 hours as needed for pain and inflammation to the area.  He may also take Tylenol 1000 mg every 6 hours as needed for pain.  If you develop any new or worsening symptoms over the next few days, please return to urgent care.  If your symptoms are severe, please go to the nearest ER for further evaluation.  I hope you feel better!    ED Prescriptions   None    PDMP not reviewed this encounter.   Carlisle Beers, [place] 08/02/22 [phone]

## 2023-01-04 ENCOUNTER — Ambulatory Visit
Admission: EM | Admit: 2023-01-04 | Discharge: 2023-01-04 | Disposition: A | Discharge status: Home / Self Care | Payer: BC Managed Care – PPO | Attending: Urgent Care | Admitting: Urgent Care

## 2023-01-04 DIAGNOSIS — M549 Dorsalgia, unspecified: Secondary | ICD-10-CM | POA: Diagnosis not present

## 2023-01-04 DIAGNOSIS — M5136 Other intervertebral disc degeneration, lumbar region: Secondary | ICD-10-CM

## 2023-01-04 DIAGNOSIS — M5441 Lumbago with sciatica, right side: Secondary | ICD-10-CM

## 2023-01-04 MED ORDER — PREDNISONE 50 MG PO TABS: 50.0000 mg | ORAL_TABLET | Freq: Every day | ORAL | 0 refills | Status: DC

## 2023-01-04 MED ORDER — CYCLOBENZAPRINE HCL 5 MG PO TABS: 5.0000 mg | ORAL_TABLET | Freq: Every evening | ORAL | 0 refills | Status: DC | PRN

## 2023-01-04 NOTE — ED Triage Notes (Signed)
Pt c/o right lower back radiates to RLE and right shoulder pain x 3 weeks-denies recent injury to either pain site-NAD-steady gait

## 2023-01-04 NOTE — Discharge Instructions (Addendum)
Please make an appointment with [facility] for a consultation. In the meantime, take prednisone to help with the pain and inflammation likely associated with sciatica and your lumbar bulging disc. Use cyclobenzaprine at bedtime as needed.

## 2023-01-04 NOTE — ED Provider Notes (Addendum)
[facility]  Note:  This document was prepared using Conservation officer, historic buildings and may include unintentional dictation errors.  MRN: [id number] DOB: 1981/02/14  Subjective:   Nicole [name] is a 42 y.o. female presenting for 3-week history of persistent right lower back pain that radiates into the entire right leg.  Has also had upper back pain worse to the right side.  Has a history of a bulging disc of the lumbar region.  Does not see anyone specifically for this.  Years ago they did offer surgery but the patient declined due to the risk for complications.  No fall, trauma, numbness or tingling, saddle paresthesia, changes to bowel or urinary habits, radicular symptoms.   No current facility-administered medications for this encounter.  Current Outpatient Medications:    acetaminophen-codeine (TYLENOL #3) 300-30 MG tablet, Take 1 tablet by mouth daily as needed for moderate pain., Disp: 20 tablet, Rfl: 0   cetirizine (ZYRTEC) 10 MG tablet, Take 10 mg by mouth daily., Disp: , Rfl:    ibuprofen (ADVIL,MOTRIN) 600 MG tablet, Take 1 tablet (600 mg total) by mouth every 6 (six) hours as needed., Disp: 30 tablet, Rfl: 0   losartan (COZAAR) 50 MG tablet, Take 50 mg by mouth daily., Disp: , Rfl:    naproxen (NAPROSYN) 500 MG tablet, Take 1 tablet (500 mg total) by mouth 2 (two) times daily with a meal., Disp: 12 tablet, Rfl: 0   ondansetron (ZOFRAN ODT) 4 MG disintegrating tablet, Take 1 tablet (4 mg total) by mouth every 8 (eight) hours as needed for nausea or vomiting., Disp: 20 tablet, Rfl: 0   Allergies  Allergen Reactions   Oxycodone Itching    Past Medical History:  Diagnosis Date   Abdominal pain    Anemia    Asthma    Blues 04/05/09   Depression 04/13/2009   GERD (gastroesophageal reflux disease)    H/O endometritis 03/20/09   H/O migraine    History of kidney stones      x 3   Hypertension    Kidney stone    Kidney stones    Pregnancy induced  hypertension 03/10/2009   Trichomonas 2005   Wears glasses      Past Surgical History:  Procedure Laterality Date   CHOLECYSTECTOMY  05/04/2012   Procedure: LAPAROSCOPIC CHOLECYSTECTOMY WITH INTRAOPERATIVE CHOLANGIOGRAM;  Surgeon: Romie Levee, MD;  Location: [facility];  Service: General;  Laterality: N/A;  Laparoscopic cholecysectomy with intraoperative cholangiogram   KIDNEY STONE SURGERY  2005 & 2007   LITHOTRIPSY     surgery on left leg     2009   THERAPEUTIC ABORTION  2000   WISDOM TOOTH EXTRACTION  2005 & 2007    Family History  Problem Relation Age of Onset   Hypertension Mother    Cancer Sister        Leukemia   Alcohol abuse Brother     Social History   Tobacco Use   Smoking status: Never   Smokeless tobacco: Never  Vaping Use   Vaping Use: Never used  Substance Use Topics   Alcohol use: No   Drug use: No    ROS   Objective:   Vitals: BP 123/73 (BP Location: Right Arm)   Pulse (!) 56   Temp 98 F (36.7 C) (Oral)   Resp 16   LMP 11/11/2022 Comment: pt states pds are irreg x 2 years  SpO2 96%   Physical Exam Constitutional:  General: She is not in acute distress.    Appearance: Normal appearance. She is well-developed. She is not ill-appearing, toxic-appearing or diaphoretic.  HENT:     Head: Normocephalic and atraumatic.     Right Ear: External ear normal.     Left Ear: External ear normal.     Nose: Nose normal.     Mouth/Throat:     Mouth: Mucous membranes are moist.  Eyes:     General: No scleral icterus.       Right eye: No discharge.        Left eye: No discharge.     Extraocular Movements: Extraocular movements intact.  Cardiovascular:     Rate and Rhythm: Normal rate.  Pulmonary:     Effort: Pulmonary effort is normal.  Musculoskeletal:     Right shoulder: No swelling, deformity, effusion, laceration, tenderness, bony tenderness or crepitus. Normal range of motion. Normal strength.     Left shoulder: No  swelling, deformity, effusion, laceration, tenderness, bony tenderness or crepitus. Normal range of motion. Normal strength.     Cervical back: Normal range of motion and neck supple. Tenderness present. No swelling, edema, deformity, erythema, signs of trauma, lacerations, rigidity, spasms, torticollis, bony tenderness or crepitus. Pain with movement present. No spinous process tenderness or muscular tenderness. Normal range of motion.     Lumbar back: Tenderness present. No swelling, edema, deformity, signs of trauma, lacerations, spasms or bony tenderness. Normal range of motion. Positive right straight leg raise test. Negative left straight leg raise test. No scoliosis.       Back:     Comments: Tenderness over areas outlined.  Skin:    General: Skin is warm and dry.  Neurological:     General: No focal deficit present.     Mental Status: She is alert and oriented to person, place, and time.     Cranial Nerves: No cranial nerve deficit.     Motor: No weakness.     Coordination: Coordination normal.     Gait: Gait normal.     Deep Tendon Reflexes: Reflexes normal.  Psychiatric:        Mood and Affect: Mood normal.        Behavior: Behavior normal.     Assessment and Plan :   PDMP not reviewed this encounter.  1. Acute right-sided low back pain with right-sided sciatica   2. Upper back pain on right side   3. Bulging lumbar disc    Recommended a oral prednisone course for her back pains, acute on chronic sciatica.  Emphasized need for follow-up with the spine specialist.  Discussed back care.  Deferred imaging as it would be of low yield, recommend consultation for repeat MRI with the spine clinic.  Counseled patient on potential for adverse effects with medications prescribed/recommended today, ER and return-to-clinic precautions discussed, patient verbalized understanding.     Wallis Bamberg, [place] 01/04/23 1346

## 2024-06-16 ENCOUNTER — Emergency Department (HOSPITAL_COMMUNITY)

## 2024-06-16 ENCOUNTER — Emergency Department (HOSPITAL_COMMUNITY)
Admission: EM | Admit: 2024-06-16 | Discharge: 2024-06-17 | Disposition: A | Discharge status: Home / Self Care | Attending: Emergency Medicine | Admitting: Emergency Medicine

## 2024-06-16 ENCOUNTER — Other Ambulatory Visit: Payer: Self-pay

## 2024-06-16 DIAGNOSIS — K2900 Acute gastritis without bleeding: Secondary | ICD-10-CM | POA: Insufficient documentation

## 2024-06-16 DIAGNOSIS — R1013 Epigastric pain: Secondary | ICD-10-CM | POA: Diagnosis present

## 2024-06-16 LAB — COMPREHENSIVE METABOLIC PANEL WITH GFR
ALT: 23 U/L (ref 0–44)
AST: 22 U/L (ref 15–41)
Albumin: 3.4 g/dL — ABNORMAL LOW (ref 3.5–5.0)
Alkaline Phosphatase: 74 U/L (ref 38–126)
Anion gap: 8 (ref 5–15)
BUN: 7 mg/dL (ref 6–20)
CO2: 28 mmol/L (ref 22–32)
Calcium: 9.3 mg/dL (ref 8.9–10.3)
Chloride: 102 mmol/L (ref 98–111)
Creatinine, Ser: 0.54 mg/dL (ref 0.44–1.00)
GFR, Estimated: >60 mL/min (ref >60)
Glucose, Bld: 103 mg/dL — ABNORMAL HIGH (ref 70–99)
Potassium: 4.1 mmol/L (ref 3.5–5.1)
Sodium: 138 mmol/L (ref 135–145)
Total Bilirubin: 0.4 mg/dL (ref 0.0–1.2)
Total Protein: 7.4 g/dL (ref 6.5–8.1)

## 2024-06-16 LAB — CBC
HCT: 40 % (ref 36.0–46.0)
Hemoglobin: 12.4 g/dL (ref 12.0–15.0)
MCH: 26.3 pg (ref 26.0–34.0)
MCHC: 31 g/dL (ref 30.0–36.0)
MCV: 84.9 fL (ref 80.0–100.0)
Platelets: 248 K/uL (ref 150–400)
RBC: 4.71 MIL/uL (ref 3.87–5.11)
RDW: 14.6 % (ref 11.5–15.5)
WBC: 11 K/uL — ABNORMAL HIGH (ref 4.0–10.5)
nRBC: 0 % (ref 0.0–0.2)

## 2024-06-16 LAB — TROPONIN I (HIGH SENSITIVITY)
Troponin I (High Sensitivity): 3 ng/L (ref <18)
Troponin I (High Sensitivity): 3 ng/L (ref <18)

## 2024-06-16 LAB — URINALYSIS, ROUTINE W REFLEX MICROSCOPIC
Bilirubin Urine: NEGATIVE
Glucose, UA: NEGATIVE mg/dL
Hgb urine dipstick: NEGATIVE
Ketones, ur: NEGATIVE mg/dL
Nitrite: NEGATIVE
Protein, ur: NEGATIVE mg/dL
Specific Gravity, Urine: 1.017 (ref 1.005–1.030)
pH: 5 (ref 5.0–8.0)

## 2024-06-16 LAB — LIPASE, BLOOD: Lipase: 32 U/L (ref 11–51)

## 2024-06-16 LAB — HCG, SERUM, QUALITATIVE: Preg, Serum: NEGATIVE

## 2024-06-16 NOTE — ED Notes (Signed)
 Unable to obtain labs

## 2024-06-16 NOTE — ED Provider Triage Note (Signed)
 Emergency Medicine Provider Triage Evaluation Note  Nicole [name] , a 43 y.o. female  was evaluated in triage.  Pt complains of epigastric burning pain, not better or worse with food. Abdominal pain intermittent since Aug, saw GI last month. Burping and lots of flatulence.  Worse with ambulation.   Endorses nausea, headache (L sided, pulsatile, accompanied with light and sound sensitivity.)   Denies fever, blurry vision, cough, congestion, shortness of breath, diarrhea, melena, hematochezia, dysuria, LE swelling.   Review of Systems  Positive: N/a Negative: N/a  Physical Exam  BP 135/71 (BP Location: Left Arm)   Pulse 70   Temp (!) 97.5 F (36.4 C)   Resp 17   Ht 5' 6 (1.676 m)   Wt 102.5 kg   LMP 05/26/2024   SpO2 97%   BMI 36.48 kg/m  Gen:   Awake, no distress   Resp:  Normal effort  MSK:   Moves extremities without difficulty  Other:    Medical Decision Making  Medically screening exam initiated at 1:59 PM.  Appropriate orders placed.  Nicole [name] was informed that the remainder of the evaluation will be completed by another provider, this initial triage assessment does not replace that evaluation, and the importance of remaining in the ED until their evaluation is complete.     Beola Terrall RAMAN, [place] 06/16/24 [phone]

## 2024-06-16 NOTE — ED Triage Notes (Signed)
 PT presents for 9/10 abd pain with nausea.  No V/D.

## 2024-06-17 ENCOUNTER — Emergency Department (HOSPITAL_COMMUNITY)

## 2024-06-17 MED ORDER — SUCRALFATE 1 G PO TABS
1.0000 g | ORAL_TABLET | Freq: Three times a day (TID) | ORAL | 0 refills | Status: AC
Start: 2024-06-17 — End: ?

## 2024-06-17 MED ORDER — ONDANSETRON HCL 4 MG/2ML IJ SOLN
4.0000 mg | Freq: Once | INTRAMUSCULAR | Status: AC
  Administered 2024-06-17: 4 mg via INTRAVENOUS
  Filled 2024-06-17: qty 2

## 2024-06-17 MED ORDER — IOHEXOL 350 MG/ML SOLN
75.0000 mL | Freq: Once | INTRAVENOUS | Status: AC | PRN
  Administered 2024-06-17: 75 mL via INTRAVENOUS

## 2024-06-17 MED ORDER — HYDROCODONE-ACETAMINOPHEN 5-325 MG PO TABS: 1.0000 | ORAL_TABLET | ORAL | 0 refills | Status: AC | PRN

## 2024-06-17 MED ORDER — ONDANSETRON 4 MG PO TBDP
ORAL_TABLET | ORAL | 0 refills | Status: AC
Start: 2024-06-17 — End: ?

## 2024-06-17 MED ORDER — MORPHINE SULFATE (PF) 4 MG/ML IV SOLN
4.0000 mg | Freq: Once | INTRAVENOUS | Status: AC
  Administered 2024-06-17: 4 mg via INTRAVENOUS
  Filled 2024-06-17: qty 1

## 2024-06-17 NOTE — Discharge Instructions (Addendum)
 Do not take any NSAIDs: Ibuprofen  (Advil , Motrin ), Naproxen  (Aleve , Naprosyn ), Celecoxib (Celebrex), Diclofenac (Voltaren), Indomethacin (Indocin), Meloxicam  (Mobic ),  Aspirin, Goody's Powder, BC Powder   Schedule follow-up with your gastroenterologist.  Return to the emergency department for vomiting blood or black stools.

## 2024-06-17 NOTE — ED Provider Notes (Signed)
 [place] EMERGENCY DEPARTMENT AT [facility] Provider Note   CSN: [id number] Arrival date & time: 06/16/24  1225     Patient presents with: Abdominal Pain and Nausea   Nicole [name] is a 43 y.o. female.   Patient presents to the emergency department for evaluation of abdominal pain.  Patient reports that she has been having problems since July.  She has had a prior cholecystectomy.  Pain is diffuse, burning sensation across the upper abdomen.  Her doctor has put her on a PPI and sucralfate, patient has GI follow-up on the 14th of this month.  She reports, however, that for the last 3 or 4 days she has been experiencing increased pain, keeping her up at night.  Patient does report that she feels constipated as well.       Prior to Admission medications   Medication Sig Start Date End Date Taking? Authorizing Provider  HYDROcodone -acetaminophen  (NORCO/VICODIN) 5-325 MG tablet Take 1 tablet by mouth every 4 (four) hours as needed for severe pain (pain score 7-10). 06/17/24  Yes Riyad Keena, Lonni PARAS, MD  ondansetron  (ZOFRAN -ODT) 4 MG disintegrating tablet 4mg  ODT q4 hours prn nausea/vomit 06/17/24  Yes Evi Mccomb, Lonni PARAS, MD  sucralfate (CARAFATE) 1 g tablet Take 1 tablet (1 g total) by mouth 4 (four) times daily -  with meals and at bedtime. 06/17/24  Yes Chiann Goffredo, Lonni PARAS, MD  acetaminophen -codeine  (TYLENOL  #3) 300-30 MG tablet Take 1 tablet by mouth daily as needed for moderate pain. 05/11/21   Magnant, Charles L, PA-C  cetirizine (ZYRTEC) 10 MG tablet Take 10 mg by mouth daily.    [provider]  losartan (COZAAR) 50 MG tablet Take 50 mg by mouth daily.    [provider]    Allergies: Oxycodone     Review of Systems  Updated Vital Signs BP 119/76   Pulse 67   Temp 97.9 F (36.6 C) (Oral)   Resp 18   Ht 5' 6 (1.676 m)   Wt 102.5 kg   LMP 05/26/2024   SpO2 100%   BMI 36.48 kg/m   Physical Exam Vitals and nursing note reviewed.   Constitutional:      General: She is not in acute distress.    Appearance: She is well-developed.  HENT:     Head: Normocephalic and atraumatic.     Mouth/Throat:     Mouth: Mucous membranes are moist.  Eyes:     General: Vision grossly intact. Gaze aligned appropriately.     Extraocular Movements: Extraocular movements intact.     Conjunctiva/sclera: Conjunctivae normal.  Cardiovascular:     Rate and Rhythm: Normal rate and regular rhythm.     Pulses: Normal pulses.     Heart sounds: Normal heart sounds, S1 normal and S2 normal. No murmur heard.    No friction rub. No gallop.  Pulmonary:     Effort: Pulmonary effort is normal. No respiratory distress.     Breath sounds: Normal breath sounds.  Abdominal:     General: Bowel sounds are normal.     Palpations: Abdomen is soft.     Tenderness: There is abdominal tenderness in the epigastric area. There is no guarding or rebound.     Hernia: No hernia is present.  Musculoskeletal:        General: No swelling.     Cervical back: Full passive range of motion without pain, normal range of motion and neck supple. No spinous process tenderness or muscular tenderness. Normal range  of motion.     Right lower leg: No edema.     Left lower leg: No edema.  Skin:    General: Skin is warm and dry.     Capillary Refill: Capillary refill takes less than 2 seconds.     Findings: No ecchymosis, erythema, rash or wound.  Neurological:     General: No focal deficit present.     Mental Status: She is alert and oriented to person, place, and time.     GCS: GCS eye subscore is 4. GCS verbal subscore is 5. GCS motor subscore is 6.     Cranial Nerves: Cranial nerves 2-12 are intact.     Sensory: Sensation is intact.     Motor: Motor function is intact.     Coordination: Coordination is intact.  Psychiatric:        Attention and Perception: Attention normal.        Mood and Affect: Mood normal.        Speech: Speech normal.        Behavior:  Behavior normal.     (all labs ordered are listed, but only abnormal results are displayed) Labs Reviewed  COMPREHENSIVE METABOLIC PANEL WITH GFR - Abnormal; Notable for the following components:      Result Value   Glucose, Bld 103 (*)    Albumin 3.4 (*)    All other components within normal limits  CBC - Abnormal; Notable for the following components:   WBC 11.0 (*)    All other components within normal limits  URINALYSIS, ROUTINE W REFLEX MICROSCOPIC - Abnormal; Notable for the following components:   APPearance HAZY (*)    Leukocytes,Ua TRACE (*)    Bacteria, UA RARE (*)    All other components within normal limits  LIPASE, BLOOD  HCG, SERUM, QUALITATIVE  TROPONIN I (HIGH SENSITIVITY)  TROPONIN I (HIGH SENSITIVITY)    EKG: None  Radiology: CT ABDOMEN PELVIS W CONTRAST Result Date: 06/17/2024 CLINICAL DATA:  Epigastric burning. EXAM: CT ABDOMEN AND PELVIS WITH CONTRAST TECHNIQUE: Multidetector CT imaging of the abdomen and pelvis was performed using the standard protocol following bolus administration of intravenous contrast. RADIATION DOSE REDUCTION: This exam was performed according to the departmental dose-optimization program which includes automated exposure control, adjustment of the mA and/or kV according to patient size and/or use of iterative reconstruction technique. CONTRAST:  75mL OMNIPAQUE  IOHEXOL  350 MG/ML SOLN COMPARISON:  February 22, 2017 FINDINGS: Lower chest: No acute abnormality. Hepatobiliary: A 6 mm focus of parenchymal low attenuation is seen within the posterolateral aspect of the right lobe of the liver. Status post cholecystectomy. No biliary dilatation. Pancreas: Unremarkable. No pancreatic ductal dilatation or surrounding inflammatory changes. Spleen: Normal in size without focal abnormality. Adrenals/Urinary Tract: Adrenal glands are unremarkable. Kidneys are normal, without renal calculi, focal lesion, or hydronephrosis. The urinary bladder is poorly  distended and subsequently limited in evaluation. Stomach/Bowel: Stomach is within normal limits. Appendix appears normal. No evidence of bowel wall thickening, distention, or inflammatory changes. Vascular/Lymphatic: No significant vascular findings are present. No enlarged abdominal or pelvic lymph nodes. Reproductive: Uterus and bilateral adnexa are unremarkable. Other: A 3.6 cm x 4.6 cm x 3.4 cm fat containing supraumbilical hernia is noted. No abdominopelvic ascites. Musculoskeletal: Marked severity degenerative changes are seen at the level of L5-S1. IMPRESSION: 1. Fat-containing supraumbilical hernia. 2. Evidence of prior cholecystectomy. 3. 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 Electronically Signed   By: Suzen Dials M.D.   On:  06/17/2024 02:24   DG Chest 2 View Result Date: 06/16/2024 CLINICAL DATA:  Chest pain, epigastric pain. EXAM: CHEST - 2 VIEW COMPARISON:  12/14/2018. FINDINGS: Trachea is midline. Heart size stable. Lungs are clear. No pleural fluid. IMPRESSION: Negative. Electronically Signed   By: Newell Eke M.D.   On: 06/16/2024 14:48     Procedures   Medications Ordered in the ED  morphine (PF) 4 MG/ML injection 4 mg (has no administration in time range)  ondansetron  (ZOFRAN ) injection 4 mg (has no administration in time range)  iohexol  (OMNIPAQUE ) 350 MG/ML injection 75 mL (75 mLs Intravenous Contrast Given 06/17/24 0206)                                    Medical Decision Making Amount and/or Complexity of Data Reviewed Labs: ordered. Decision-making details documented in ED Course. Radiology: ordered and independent interpretation performed. Decision-making details documented in ED Course.  Risk Prescription drug management.   Differential Diagnosis considered includes, but not limited to: Cholelithiasis; cholecystitis; cholangitis; bowel obstruction; esophagitis; gastritis; peptic ulcer disease; pancreatitis; cardiac.  Patient  presents to the emergency department for evaluation of abdominal pain.  Patient has experiencing pain for months.  She does report that it worsens when she eats spicy or greasy foods.  She has had prior cholecystectomy.  Primary care has placed her on a PPI and Carafate without improvement.  She has GI follow-up pending.  Patient's blood work is reassuring.  No sign of blood loss, no melanotic stools or hematemesis.  Patient underwent CT scan to evaluate for pathology.  No abnormalities are seen.  Suspect severe gastritis and/or ulcer.  Outpatient records reviewed.  Patient is currently on Protonix 40 mg twice daily.  Will continue this.  Can retry Carafate, limited supply of analgesia, no NSAIDs.  Needs to follow-up with her GI doctor.     Final diagnoses:  Acute gastritis without hemorrhage, unspecified gastritis type    ED Discharge Orders          Ordered    sucralfate (CARAFATE) 1 g tablet  3 times daily with meals & bedtime        06/17/24 0313    HYDROcodone -acetaminophen  (NORCO/VICODIN) 5-325 MG tablet  Every 4 hours PRN        06/17/24 0313    ondansetron  (ZOFRAN -ODT) 4 MG disintegrating tablet        06/17/24 0313               Haze Lonni PARAS, MD 06/17/24 [phone]
